# Patient Record
Sex: Male | Born: 1975 | Race: White | Hispanic: No | Marital: Married | Smoking: Former smoker
Health system: Southern US, Community
[De-identification: ages and names within clinical notes are randomized; demographics above are authoritative.]

## PROBLEM LIST (undated history)

## (undated) DIAGNOSIS — D649 Anemia, unspecified: Secondary | ICD-10-CM

## (undated) DIAGNOSIS — H3320 Serous retinal detachment, unspecified eye: Secondary | ICD-10-CM

## (undated) DIAGNOSIS — I4510 Unspecified right bundle-branch block: Secondary | ICD-10-CM

## (undated) DIAGNOSIS — E785 Hyperlipidemia, unspecified: Secondary | ICD-10-CM

## (undated) HISTORY — DX: Unspecified right bundle-branch block: I45.10

## (undated) HISTORY — DX: Serous retinal detachment, unspecified eye: H33.20

## (undated) HISTORY — PX: EYE SURGERY: SHX253

## (undated) HISTORY — PX: SKIN BIOPSY: SHX1

## (undated) HISTORY — DX: Anemia, unspecified: D64.9

## (undated) HISTORY — PX: RETINAL DETACHMENT SURGERY: SHX105

## (undated) HISTORY — DX: Hyperlipidemia, unspecified: E78.5

---

## 2001-10-17 ENCOUNTER — Encounter: Payer: Self-pay | Admitting: Ophthalmology

## 2001-10-18 ENCOUNTER — Ambulatory Visit (HOSPITAL_COMMUNITY): Admission: RE | Admit: 2001-10-18 | Discharge: 2001-10-19 | Payer: Self-pay | Admitting: Ophthalmology

## 2009-03-21 ENCOUNTER — Encounter: Payer: Self-pay | Admitting: Cardiovascular Disease

## 2009-04-04 ENCOUNTER — Ambulatory Visit: Payer: Self-pay | Admitting: Cardiovascular Disease

## 2009-04-04 DIAGNOSIS — H3320 Serous retinal detachment, unspecified eye: Secondary | ICD-10-CM | POA: Insufficient documentation

## 2009-04-04 DIAGNOSIS — F411 Generalized anxiety disorder: Secondary | ICD-10-CM | POA: Insufficient documentation

## 2009-04-04 DIAGNOSIS — I4510 Unspecified right bundle-branch block: Secondary | ICD-10-CM

## 2009-04-04 HISTORY — DX: Serous retinal detachment, unspecified eye: H33.20

## 2009-04-19 ENCOUNTER — Encounter: Payer: Self-pay | Admitting: Cardiovascular Disease

## 2009-04-19 ENCOUNTER — Ambulatory Visit: Payer: Self-pay

## 2009-04-19 ENCOUNTER — Ambulatory Visit: Payer: Self-pay | Admitting: Cardiology

## 2009-04-19 ENCOUNTER — Ambulatory Visit (HOSPITAL_COMMUNITY): Admission: RE | Admit: 2009-04-19 | Discharge: 2009-04-19 | Payer: Self-pay | Admitting: Cardiovascular Disease

## 2009-05-02 ENCOUNTER — Ambulatory Visit: Payer: Self-pay | Admitting: Cardiovascular Disease

## 2010-04-13 NOTE — Assessment & Plan Note (Signed)
Summary: per check out/sf   Visit Type:  rov Primary Provider:  Marisue Brooklyn  CC:  no cardiac complaints today.  History of Present Illness: 35 yo male with h/o anxiety who was seen four weeks ago for further evaluation of abnormal EKG. He was recently found to have a RBBB on resting EKG at his primary care physicians office. He does have occasional anxiety but no chest pain, SOB, palpitations, dizziness, near syncope or syncope. He has had no prior cardiac issues. He works full time and is not limited by any symptoms. He does occasionally drink energy drinks. At the first visit, there was suggestion of incomplete RBBB with RVH. I ordered an echo which showed normal LV size and function with no valvular abnormalities.   Current Medications (verified): 1)  Citalopram Hydrobromide 20 Mg Tabs (Citalopram Hydrobromide) .Marland Kitchen.. 1 Tab Once Daily  Allergies (verified): No Known Drug Allergies  Past History:  Past Medical History: Reviewed history from 04/04/2009 and no changes required. RIGHT BUNDLE BRANCH BLOCK (ICD-426.4) ANXIETY (ICD-300.00) DETACHED RETINA 2004  Social History: Reviewed history from 04/04/2009 and no changes required. Tobacco Use - No.  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    Vital Signs:  Patient profile:   35 year old male Height:      71 inches Weight:      168 pounds Pulse rate:   72 / minute Pulse  rhythm:   regular BP sitting:   100 / 60  (left arm) Cuff size:   large  Vitals Entered By: Danielle Rankin, CMA (May 02, 2009 4:17 PM)  Physical Exam  General:  General: Well developed, well nourished, NAD Musculoskeletal: Muscle strength 5/5 all ext Psychiatric: Mood and affect normal Neck: No JVD, no carotid bruits, no thyromegaly, no lymphadenopathy. Lungs:Clear bilaterally, no wheezes, rhonci, crackles CV: RRR no murmurs, gallops rubs Abdomen: soft, NT, ND, BS present Extremities: No edema, pulses 2+.    Echocardiogram  Procedure date:  04/19/2009  Findings:      - Left ventricle: The cavity size was normal. Wall thickness was       normal. The estimated ejection fraction was 65%. Wall motion was       normal; there were no regional wall motion abnormalities.     - Mitral valve: There is flat closure of the mitral valve. There is       no MR.  Impression & Recommendations:  Problem # 1:  RIGHT BUNDLE BRANCH BLOCK (ICD-426.4) Echo with normal RV and LV size and function. No wall motion abnormalities. No significant valvular issues. I have reassured him that the incomplete RBBB if likely an incidental finding without poor long term prognosis. I have given him a copy of his EKG. No further cardiac workup indicated.   Patient Instructions: 1)  Your physician recommends that you schedule a follow-up appointment in: as needed.

## 2010-04-13 NOTE — Assessment & Plan Note (Signed)
Summary: np6/abn ekg/jml   Visit Type:  Initial Consult Primary Provider:  Marisue Brooklyn  CC:  No complaints.  History of Present Illness: 35 yo male with h/o anxiety who is referred today for further evaluation of abnormal EKG. He was recently found to have a RBBB on resting EKG at his primary care physicians office. He does have occasional anxiety but no chest pain, SOB, palpitations, dizziness, near syncope or syncope. He has had no prior cardiac issues. He works full time and is not limited by any symptoms. He does occasionally drink energy drinks.   Current Medications (verified): 1)  Citalopram Hydrobromide 20 Mg Tabs (Citalopram Hydrobromide) .Marland Kitchen.. 1 Tab Once Daily  Allergies (verified): No Known Drug Allergies  Past History:  Past Medical History: RIGHT BUNDLE BRANCH BLOCK (ICD-426.4) ANXIETY (ICD-300.00) DETACHED RETINA 2004  Past Surgical History: Retina repair  Family History: Mother- alive, CAD/MI at age 81 Father-alive, HTN Brother-younger,healthy  Social History: Tobacco Use - No.  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    Vital Signs:  Patient profile:   35 year old male Height:      71 inches Weight:      170 pounds BMI:     23.80 Pulse rate:   57 / minute Pulse rhythm:   irregular BP sitting:   110 / 68  (left arm) Cuff size:   large  Vitals Entered By: Danielle Rankin, CMA (April 04, 2009  11:17 AM)  Physical Exam  General:  General: Well developed, well nourished, NAD HEENT: OP clear, mucus membranes moist SKIN: warm, dry Neuro: No focal deficits Musculoskeletal: Muscle strength 5/5 all ext Psychiatric: Mood and affect normal Neck: No JVD, no carotid bruits, no thyromegaly, no lymphadenopathy. Lungs:Clear bilaterally, no wheezes, rhonci, crackles CV: RRR no murmurs, gallops rubs Abdomen: soft, NT, ND, BS present Extremities: No edema, pulses 2+.    EKG  Procedure date:  04/04/2009  Findings:      Sinus bradycardia, rate 57 bpm. Incomplete RBBB. RVH.   Impression & Recommendations:  Problem # 1:  RIGHT BUNDLE BRANCH BLOCK (ICD-426.4) Asymptomatic. Will check echocardiogram. I have discussed  the RBBB and the nature of conduction disturbances. RBBB is usually not associated with other cardiac abnormalities. There is no poor long term prognosis with this finding. I will see him back in several weeks to review his echo. No other workup indicated at this time.   Orders: EKG w/ Interpretation (93000) Echocardiogram (Echo)  Patient Instructions: 1)  Your physician recommends that you schedule a follow-up appointment in: 3-4 weeks 2)  Your physician has requested that you have an echocardiogram.  Echocardiography is a painless test that uses sound waves to create images of your heart. It provides your doctor with information about the size and shape of your heart and how well your heart's chambers and valves are working.  This procedure takes approximately one hour. There are no restrictions for this procedure.

## 2010-04-13 NOTE — Letter (Signed)
Summary: [facility] Office Note  [facility] Office Note   Imported By: Roderic Ovens 04/07/2009 12:26:11  _____________________________________________________________________  External Attachment:    Type:   Image     Comment:   External Document

## 2010-07-28 NOTE — H&P (Signed)
NAME:  Hector Bernard, Hector Bernard                        ACCOUNT NO.:  [id number]   MEDICAL RECORD NO.:  [id number]                   PATIENT TYPE:  OIB   LOCATION:  5735                                 FACILITY:  [facility]   PHYSICIAN:  Guadelupe Sabin, M.D.             DATE OF BIRTH:  1975-10-18   DATE OF ADMISSION:  10/18/2001  DATE OF DISCHARGE:                                HISTORY & PHYSICAL   CHIEF COMPLAINT:  This was an urgent outpatient admission of this 35-year-  old white male admitted with a  rhegmatogenous retinal detachment of the  right eye.   HISTORY OF PRESENT ILLNESS:  This patient denies any specific trauma to the  right eye.  The patient has noted the gradual onset of a visual field defect  in the right eye.  He was seen by Dr. Ernesto Rutherford who noted a retinal  detachment and referred the patient to my office for further evaluation.   PAST MEDICAL HISTORY:  The patient is healthy with a last physical  examination 4 years ago for a job-related physical.   SOCIAL HISTORY:  The patient is married, is a nonsmoker, and takes  occasional social alcoholic drink.   MEDICATIONS:  No current medications.  The patient takes ibuprofen  occasionally for headache and took 2 tablets on October 17, 2001.   REVIEW OF SYSTEMS:  No cardiorespiratory complaints.   PHYSICAL EXAMINATION:  VITAL SIGNS:  As recorded on admission.  Blood  pressure 118/78, respirations 16, heart rate 67, temperature 97.5.  GENERAL APPEARANCE:  The patient is alert, well-nourished, well-developed  white male in acute ocular distress.  HEENT:  Eyes:  Visual acuity without correction, 20/70 right eye, 20/40 left  eye.  Applanation tonometry 16 mm right eye, 19 left eye, at 3:45 p.m.  External ocular and slit-lamp examination:  The eyes are wide and clear with  a clear cornea, deep and clear anterior chamber, and clear lens.  Detailed  fundus examination dilated with Mydriacyl:  Right eye, the vitreous is  essentially clear.  The retina is detached from the 10 to 4 o'clock  position.  From the 11:30 to 2:30 position, there is choroidoretinal atrophy  and scarring at the vitreous base and along the ora serrata.  There is a  small dialysis-like defect at the 2:30 position and also at the 11:30  position.  The detachment has not spread into the macula.  There is a  superficial retinal hemorrhage inferior to the macula.  Left eye, vitreous  clear, retina attached, normal optic nerve, blood vessels, and macula.  No  retinal tear or detachment area seen.  CHEST:  Lungs clear to percussion and auscultation.  HEART:  Normal sinus rhythm.  No cardiomegaly, no murmurs.  ABDOMEN:  Negative.  EXTREMITIES:  Negative.   ADMISSION DIAGNOSIS:  Rhegmatogenous retinal detachment, right eye.    SURGICAL PLAN:  Scleral buckling procedure, right eye.  The patient has been  given oral discussion and printed information concerning the retinal  detachment and its surgery.  He has signed an informed consent and  arrangements made for his outpatient admission at this time.                                               Guadelupe Sabin, M.D.    HNJ/MEDQ  D:  10/18/2001  T:  10/22/2001  Job:  [id number]   cc:   Molly Maduro L. Dione Booze, M.D.

## 2010-07-28 NOTE — Op Note (Signed)
NAME:  Hector Bernard, Hector Bernard                        ACCOUNT NO.:  [id number]   MEDICAL RECORD NO.:  [id number]                   PATIENT TYPE:  OIB   LOCATION:  5735                                 FACILITY:  [facility]   PHYSICIAN:  Guadelupe Sabin, M.D.             DATE OF BIRTH:  06-Mar-1976   DATE OF PROCEDURE:  10/18/2001  DATE OF DISCHARGE:                                 OPERATIVE REPORT   PREOPERATIVE DIAGNOSIS:  Rhegmatogenous retinal detachment, right eye.   POSTOPERATIVE DIAGNOSIS:  Rhegmatogenous retinal detachment, right eye.   OPERATION:  Scleral buckling procedure, right eye, using solid silicone  implants, #377 and #240, cryoapplication.  Diathermy application.  Transscleral drainage of subretinal fluid.  Paracentesis.   SURGEON:  Guadelupe Sabin, M.D.   ASSISTANT:  Nurse.   ANESTHESIA:  General.  Ophthalmoscopy as previously drawn.   OPERATIVE PROCEDURE:  After the patient was prepped and draped, lid traction  sutures were placed in the right upper and lower lids.  A lid speculum was  inserted.  A peritomy was performed adjacent to the limbus 360 degrees.  The  subconjunctival tissue was cleaned and the sclera inspected and appeared to  be in satisfactory thickness for lamellar scleral dissection.  Indirect  ophthalmoscopy was then performed with a retinal cryoprobe scleral  depression.  Cryoapplications were applied along the area of the retinal  breaks at the ora serrata from the 11 to 2:30 position.  Choroidoretinal  atrophy and scarring were noted.  It was felt that these findings were  consistent with the previous trauma to the eye but no trauma has been  elicited from the patient's history.  These findings were consistent with ?  trauma, but the patient has no history of direct injury or blow to the right  eye.  It was then elected to perform lamellar scleral dissection from the  10:30 to 4:00 position, the bed measuring 9 mm in width.  Right __________  diathermy applications were applied to the inner scleral lamella.  A total  of 5 - 4.0 green [place] sutures were used to close the sterile flaps over  a trimmed #277 solid silicone implant.  A #240 solid silicone encircling dam  was placed about the globe, tied with a suture at the 8 o'clock position.  An anchoring band suture was placed at the 7:30 position to hold the  encircling band at the equator.  After repeat indirect ophthalmoscopy was  elected to drain fluid in the bed at the 12:30 position.  Incision was made  through the scleral, the choroid exposed, treated with light diathermy, and  then perforated with the pin electrode.  A moderate amount of subretinal  fluid drained and then stopped.  There appeared to a retinal incarceration  at the drain site and no further drainage was performed.  Due to the minimal  amount of subretinal fluid present remaining, it was felt that a  paracentesis was safer than continued subretinal fluid drainage.  A  paracentesis was performed at the 9 o'clock position using a [employer]  blade.  The scleral flaps were then pulled up creating a scleral buckling  indentation.  The buckle appeared to be in good position.  The  cryoapplications and diathermy applications could be seen extending from the  treated area from the 11 to 4 o'clock position.  It was then elected to  close.  The patient had been given Diamox 500 mg intravenously to further  lower the interocular pressure.  Tenon capsule was pulled forward in the  four quadrants and tied as a separate layer.  Full strength Neosporin was  irrigated in the subtenon space.  The conjunctiva was then pulled forward  and closed with a running 6-0 chromic catgut suture.  __________ garamycin  and dexamethasone were injected in the subtenon space inferiorly.  Maxitrol  and atropine ointment were instilled in the conjunctival cul-de-sac and a  light  patch and protective sheild applied to the operated  eye.  Duration of  procedure and anesthesia administration:  1 1/2 to 2 hours.  The patient  tolerated the procedure well in general and left the operating room for the  recovery room and subsequently to the 2300 hour observation unit.                                                Guadelupe Sabin, M.D.    HNJ/MEDQ  D:  10/18/2001  T:  10/22/2001  Job:  [id number]   cc:   Molly Maduro L. Dione Booze, M.D.

## 2010-07-28 NOTE — Discharge Summary (Signed)
   NAME:  Hector Bernard, Hector Bernard                        ACCOUNT NO.:  [id number]   MEDICAL RECORD NO.:  [id number]                   PATIENT TYPE:  OIB   LOCATION:  5735                                 FACILITY:  [facility]   PHYSICIAN:  Amiya Escamilla DICTATOR                    DATE OF BIRTH:  [birth year]   DATE OF ADMISSION:  10/18/2001  DATE OF DISCHARGE:                                 DISCHARGE SUMMARY   HISTORY OF PRESENT ILLNESS:  This was an emergency outpatient admission of  this 35 year old white male admitted with a rhegmatogenous retinal  detachment of the right eye.   HOSPITAL COURSE:  The patient was evaluated preoperatively and felt to be in  satisfactory condition for the proposed surgery. He therefore was taken into  the operating room where a scleral buckling procedure was performed under  general anesthesia using ____ implants #277 and #240 with cryo applications,  diathermia application, and external drainage of subretinal fluid and  paracentesis. The patient tolerated the one and one half hour procedure  under general anesthesia well and was taken to the recovery room and  subsequently to the 23 hour observation unit. The patient was seen on the  evening of surgery and the following morning and felt to be doing well. The  subretinal fluid had absorbed completely and a good buckling indentation was  present in the area of the peripheral choreal retinal scarring and retinal  tearing from the 11:00 o'clock to 4:00 o'clock position. The patient and his  wife were given a printed list of discharge instructions on the care and use  of the operated eye.   DISCHARGE MEDICATIONS:  1. TobraDex and Cyclomydril ophthalmic solutions one drop five to ten     minutes four times daily.  2. Maxitrol and Atropine at bedtime.   FOLLOW UP:  In my office in three to five days.   DISCHARGE CONDITION::  Improved.   DISCHARGE DIAGNOSIS:  Rhegmatogenous retinal detachment, right eye, with  multiple  defects.                                               Zakirah Weingart DICTATOR    DD/MEDQ  D:  10/19/2001  T:  10/23/2001  Job:  [id number]   cc:   Molly Maduro L. Dione Booze, M.D.

## 2013-01-26 ENCOUNTER — Encounter: Payer: Self-pay | Admitting: Internal Medicine

## 2013-01-27 ENCOUNTER — Encounter: Payer: Self-pay | Admitting: Emergency Medicine

## 2013-06-29 ENCOUNTER — Telehealth: Payer: Self-pay | Admitting: *Deleted

## 2013-06-29 ENCOUNTER — Other Ambulatory Visit: Payer: Self-pay | Admitting: Emergency Medicine

## 2013-06-29 MED ORDER — CITALOPRAM HYDROBROMIDE 40 MG PO TABS: 40.0000 mg | ORAL_TABLET | Freq: Every day | ORAL | Status: DC

## 2013-06-29 NOTE — Telephone Encounter (Signed)
REFILL CELEXA      PT COMPLETELY OUT APPT TOMORROW

## 2013-06-30 ENCOUNTER — Encounter: Payer: Self-pay | Admitting: Emergency Medicine

## 2013-07-02 ENCOUNTER — Other Ambulatory Visit: Payer: Self-pay | Admitting: Emergency Medicine

## 2013-07-07 ENCOUNTER — Ambulatory Visit (INDEPENDENT_AMBULATORY_CARE_PROVIDER_SITE_OTHER): Payer: BC Managed Care – PPO | Admitting: Emergency Medicine

## 2013-07-07 ENCOUNTER — Encounter: Payer: Self-pay | Admitting: Emergency Medicine

## 2013-07-07 VITALS — BP 116/74 | HR 76 | Temp 98.6°F | Resp 18 | Ht 70.5 in | Wt 186.0 lb

## 2013-07-07 DIAGNOSIS — IMO0002 Reserved for concepts with insufficient information to code with codable children: Secondary | ICD-10-CM

## 2013-07-07 DIAGNOSIS — R451 Restlessness and agitation: Secondary | ICD-10-CM

## 2013-07-07 DIAGNOSIS — D239 Other benign neoplasm of skin, unspecified: Secondary | ICD-10-CM

## 2013-07-07 DIAGNOSIS — S99929A Unspecified injury of unspecified foot, initial encounter: Secondary | ICD-10-CM

## 2013-07-07 DIAGNOSIS — I10 Essential (primary) hypertension: Secondary | ICD-10-CM

## 2013-07-07 DIAGNOSIS — Z79899 Other long term (current) drug therapy: Secondary | ICD-10-CM

## 2013-07-07 DIAGNOSIS — D229 Melanocytic nevi, unspecified: Secondary | ICD-10-CM

## 2013-07-07 LAB — CBC WITH DIFFERENTIAL/PLATELET
Basophils Absolute: 0.1 10*3/uL (ref 0.0–0.1)
Basophils Relative: 1 % (ref 0–1)
Eosinophils Absolute: 0.2 10*3/uL (ref 0.0–0.7)
Eosinophils Relative: 3 % (ref 0–5)
HCT: 45.3 % (ref 39.0–52.0)
Hemoglobin: 16.1 g/dL (ref 13.0–17.0)
LYMPHS ABS: 2.6 10*3/uL (ref 0.7–4.0)
LYMPHS PCT: 37 % (ref 12–46)
MCH: 32.5 pg (ref 26.0–34.0)
MCHC: 35.5 g/dL (ref 30.0–36.0)
MCV: 91.3 fL (ref 78.0–100.0)
Monocytes Absolute: 0.6 10*3/uL (ref 0.1–1.0)
Monocytes Relative: 8 % (ref 3–12)
NEUTROS ABS: 3.6 10*3/uL (ref 1.7–7.7)
NEUTROS PCT: 51 % (ref 43–77)
PLATELETS: 316 10*3/uL (ref 150–400)
RBC: 4.96 MIL/uL (ref 4.22–5.81)
RDW: 14.1 % (ref 11.5–15.5)
WBC: 7.1 10*3/uL (ref 4.0–10.5)

## 2013-07-07 MED ORDER — CITALOPRAM HYDROBROMIDE 40 MG PO TABS: 40.0000 mg | ORAL_TABLET | Freq: Every day | ORAL | Status: DC

## 2013-07-07 NOTE — Progress Notes (Signed)
   Subjective:    Patient ID: Hector Bernard, male    DOB: Aug 27, 1975, 38 y.o.   MRN: [id number]  HPI Comments: 38 yo WM for RX refill OV. He notes he is doing well on Citalopram. He went off x 4 days cold [place] and felt awful. He notes RX helps to take edge off and make him decrease anxiety/ stress level with work.  He has noticed skin change on right forearm. He gets an elevated scab that itches and falls off and then returns shortly after. He has not had f/u with derm.  He dropped board on great toe over 1 week ago. He notes initially difficult to walk but has improved. He notes still tender around nail bed.     Medication List       This list is accurate as of: 07/07/13  5:01 PM.  Always use your most recent med list.               citalopram 40 MG tablet  Commonly known as:  CELEXA  Take 1 tablet (40 mg total) by mouth daily.     multivitamin with minerals tablet  Take 1 tablet by mouth daily.         Review of Systems  Skin: Positive for color change.  All other systems reviewed and are negative.  BP 116/74  Pulse 76  Temp(Src) 98.6 F (37 C) (Temporal)  Resp 18  Ht 5' 10.5" (1.791 m)  Wt 186 lb (84.369 kg)  BMI 26.30 kg/m2     Objective:   Physical Exam  Nursing note and vitals reviewed. Constitutional: He is oriented to person, place, and time. He appears well-developed and well-nourished.  HENT:  Head: Normocephalic and atraumatic.  Right Ear: External ear normal.  Left Ear: External ear normal.  Nose: Nose normal.  Mouth/Throat: Oropharynx is clear and moist. No oropharyngeal exudate.  Eyes: Conjunctivae are normal.  Neck: Normal range of motion.  Cardiovascular: Normal rate, regular rhythm, normal heart sounds and intact distal pulses.   Pulmonary/Chest: Effort normal and breath sounds normal.  Abdominal: Soft.  Musculoskeletal: Normal range of motion. He exhibits edema and tenderness.  Great toe mild at base of nail bed tenderness with ROM   Lymphadenopathy:    He has no cervical adenopathy.  Neurological: He is alert and oriented to person, place, and time.  Skin: Skin is warm and dry.  Right forearm with 4 mm elevated, erythematous scaling area. Great toe with base of nail bed slightly lifted with + ecchymosis.   Psychiatric: He has a normal mood and affect. His behavior is normal. Judgment and thought content normal.          Assessment & Plan:  1. Right Great toe injury- advised epsom salt soaks and will probably lose nail. W/c with any concerns, declines xray  2. ?SCC right forearm- He w/c for derm evaluation  3. Anxiety/ Agitation- recheck labs with medication, continue RX AD, w/c with concerns

## 2013-07-07 NOTE — Patient Instructions (Signed)
Squamous Cell Carcinoma  Squamous cell carcinoma is the second most common form of skin cancer. It begins in the squamous cells in the outer layer of the skin (epidermis).  CAUSES  Ultraviolet light exposure is the most common cause of squamous cell carcinoma. This may come from sunlight or tanning beds. Squamous cell carcinoma is most common in sun-exposed areas like the face, neck, arms, and hands. However, squamous cell carcinoma can occur anywhere on the body, including the lips, inside the mouth, the legs, sites of long-term (chronic) scarring, and the anus.  Other causes of squamous cell carcinoma can include:  Exposure to arsenic.  Exposure to radiation.  Exposure to toxic tars and oils. RISK FACTORS Factors that increase your risk for squamous cell carcinoma include:  Having fair skin.  Being middle-aged or elderly.  Heavy sun exposure, especially during childhood.  Repeated sunburns.  Use of tanning beds.  A weakened immune system. This includes patients who have received a transplant and patients with human immunodeficiency virus (HIV) or acquired immunodeficency syndrome (AIDS).  Human papillomavirus infection.  Conditions that cause chronic scarring. This can include burn scars, chronic ulcers, heat (thermal) injuries, and radiation.  Exposure to psoralen plus ultraviolet A light therapy.  Exposure to chemical carcinogens, such as tar, soot, and arsenic.  Chronic, inflammatory conditions such as lupus, lichen planus, or lichen sclerosus.  Chronic infections, such as infections of the bone (osteomyelitis).  Smoking. SYMPTOMS  Squamous cell carcinoma often starts as small, skin-colored (pink or brown) sandpaper-like growths. These growths are called solar keratoses or actinic keratoses. These growths are often more easily felt than seen.  DIAGNOSIS  Your caregiver may be able to tell what is wrong by doing a physical exam. Often, a tissue sample is also taken. The  tissue sample is examined under a microscope.  TREATMENT  The treatment for squamous cell carcinoma depends on the size and location of the tumors, as well as your overall health. Possible treatments include:   Mohs surgery. This is a procedure done by a skin doctor (dermatologist or Mohs surgeon) in his or her office. The cancerous cells are removed layer by layer.  Laser surgery to remove the tumor.  Freezing the tumor with liquid nitrogen (cryosurgery).  Radiation. This may be used for tumors on the face.  Electrodesiccation and curettage. This involves alternately scraping and burning the tumor, using an electric current to control bleeding. If treated soon enough, squamous cell carcinoma rarely spreads to other areas of the body (metastasizes). If left untreated, however, squamous cell carcinoma will destroy the nearby tissues. This can result in the loss of a nose or ear. PREVENTION  Avoid the sun between 10:00 am and 4:00 pm when it is the strongest.  Use a sunscreen or sunblock with sun protection factor 30 or greater.  Apply sunscreen at least 30 minutes before exposure to the sun.  Reapply sunscreen every 2 to 4 hours while you are outside, after swimming, and after excessive sweating.  Always wear protective hats, clothing, and sunglasses with ultraviolet protection.  Avoid tanning beds. HOME CARE INSTRUCTIONS   Avoid unprotected sun exposure.  Do not smoke.  Follow your caregiver's instructions for self-exams. Look for new growths or changes in your skin.  Keep all follow-up appointments as directed by your caregiver. SEEK MEDICAL CARE IF:   You notice any new growths or changes in your skin.  You have had a squamous cell carcinoma tumor removed and you notice a new growth in  the same location. Document Released: 09/02/2002 Document Revised: 05/21/2011 Document Reviewed: 11/20/2010 [facility] Patient Information 2014 [employer].

## 2013-07-08 LAB — BASIC METABOLIC PANEL WITH GFR
BUN: 13 mg/dL (ref 6–23)
CO2: 28 mEq/L (ref 19–32)
Calcium: 9.9 mg/dL (ref 8.4–10.5)
Chloride: 101 mEq/L (ref 96–112)
Creat: 0.9 mg/dL (ref 0.50–1.35)
GFR, Est African American: >89 mL/min
GFR, Est Non African American: >89 mL/min
Glucose, Bld: 88 mg/dL (ref 70–99)
Potassium: 4.1 mEq/L (ref 3.5–5.3)
Sodium: 138 mEq/L (ref 135–145)

## 2013-07-08 LAB — HEPATIC FUNCTION PANEL
ALT: 20 U/L (ref 0–53)
AST: 17 U/L (ref 0–37)
Albumin: 4.6 g/dL (ref 3.5–5.2)
Alkaline Phosphatase: 63 U/L (ref 39–117)
Bilirubin, Direct: 0.1 mg/dL (ref 0.0–0.3)
Indirect Bilirubin: 0.3 mg/dL (ref 0.2–1.2)
Total Bilirubin: 0.4 mg/dL (ref 0.2–1.2)
Total Protein: 7.4 g/dL (ref 6.0–8.3)

## 2014-01-12 ENCOUNTER — Encounter: Payer: Self-pay | Admitting: Emergency Medicine

## 2014-01-28 ENCOUNTER — Encounter: Payer: Self-pay | Admitting: Emergency Medicine

## 2014-01-28 ENCOUNTER — Ambulatory Visit (INDEPENDENT_AMBULATORY_CARE_PROVIDER_SITE_OTHER): Payer: BC Managed Care – PPO | Admitting: Emergency Medicine

## 2014-01-28 VITALS — BP 110/60 | HR 62 | Temp 98.4°F | Resp 18 | Ht 70.5 in | Wt 195.0 lb

## 2014-01-28 DIAGNOSIS — Z0000 Encounter for general adult medical examination without abnormal findings: Secondary | ICD-10-CM

## 2014-01-28 DIAGNOSIS — Z111 Encounter for screening for respiratory tuberculosis: Secondary | ICD-10-CM

## 2014-01-28 DIAGNOSIS — Z125 Encounter for screening for malignant neoplasm of prostate: Secondary | ICD-10-CM

## 2014-01-28 DIAGNOSIS — Z1212 Encounter for screening for malignant neoplasm of rectum: Secondary | ICD-10-CM

## 2014-01-28 DIAGNOSIS — Z79899 Other long term (current) drug therapy: Secondary | ICD-10-CM

## 2014-01-28 DIAGNOSIS — E559 Vitamin D deficiency, unspecified: Secondary | ICD-10-CM

## 2014-01-28 LAB — CBC WITH DIFFERENTIAL/PLATELET
Basophils Absolute: 0.1 10*3/uL (ref 0.0–0.1)
Basophils Relative: 1 % (ref 0–1)
EOS PCT: 5 % (ref 0–5)
Eosinophils Absolute: 0.4 10*3/uL (ref 0.0–0.7)
HCT: 45.2 % (ref 39.0–52.0)
HEMOGLOBIN: 16 g/dL (ref 13.0–17.0)
LYMPHS ABS: 2.4 10*3/uL (ref 0.7–4.0)
Lymphocytes Relative: 31 % (ref 12–46)
MCH: 32.1 pg (ref 26.0–34.0)
MCHC: 35.4 g/dL (ref 30.0–36.0)
MCV: 90.8 fL (ref 78.0–100.0)
MONO ABS: 0.5 10*3/uL (ref 0.1–1.0)
MPV: 9.2 fL — AB (ref 9.4–12.4)
Monocytes Relative: 7 % (ref 3–12)
Neutro Abs: 4.3 10*3/uL (ref 1.7–7.7)
Neutrophils Relative %: 56 % (ref 43–77)
Platelets: 302 10*3/uL (ref 150–400)
RBC: 4.98 MIL/uL (ref 4.22–5.81)
RDW: 13.9 % (ref 11.5–15.5)
WBC: 7.7 10*3/uL (ref 4.0–10.5)

## 2014-01-28 LAB — BASIC METABOLIC PANEL WITH GFR
BUN: 15 mg/dL (ref 6–23)
CO2: 28 mEq/L (ref 19–32)
Calcium: 10.2 mg/dL (ref 8.4–10.5)
Chloride: 100 mEq/L (ref 96–112)
Creat: 0.98 mg/dL (ref 0.50–1.35)
GFR, Est Non African American: >89 mL/min
GLUCOSE: 86 mg/dL (ref 70–99)
Potassium: 4.3 mEq/L (ref 3.5–5.3)
SODIUM: 135 meq/L (ref 135–145)

## 2014-01-28 LAB — LIPID PANEL
CHOL/HDL RATIO: 3.1 ratio
Cholesterol: 180 mg/dL (ref 0–200)
HDL: 59 mg/dL (ref >39)
LDL CALC: 84 mg/dL (ref 0–99)
TRIGLYCERIDES: 183 mg/dL — AB (ref <150)
VLDL: 37 mg/dL (ref 0–40)

## 2014-01-28 LAB — MAGNESIUM: MAGNESIUM: 2 mg/dL (ref 1.5–2.5)

## 2014-01-28 LAB — HEPATIC FUNCTION PANEL
ALK PHOS: 59 U/L (ref 39–117)
ALT: 16 U/L (ref 0–53)
AST: 15 U/L (ref 0–37)
Albumin: 4.6 g/dL (ref 3.5–5.2)
BILIRUBIN TOTAL: 0.5 mg/dL (ref 0.2–1.2)
Bilirubin, Direct: 0.1 mg/dL (ref 0.0–0.3)
Indirect Bilirubin: 0.4 mg/dL (ref 0.2–1.2)
Total Protein: 7.1 g/dL (ref 6.0–8.3)

## 2014-01-28 LAB — TSH: TSH: 1.777 u[IU]/mL (ref 0.350–4.500)

## 2014-01-28 NOTE — Patient Instructions (Signed)

## 2014-01-28 NOTE — Progress Notes (Signed)
Subjective:    Patient ID: Hector Bernard, male    DOB: 1975-09-22, 38 y.o.   MRN: [id number]  HPI Comments: 38 yo WM CPE. He has restarted running since SEPT 2-3 times week. He is eating healthy and sleeping good. He is busier with work and mild fatigue but otherwise feels well. He denies any other concerns.  Lab Results      Component                Value               Date                      WBC                      7.1                 07/07/2013                HGB                      16.1                07/07/2013                HCT                      45.3                07/07/2013                PLT                      316                 07/07/2013                GLUCOSE                  88                  07/07/2013                ALT                      20                  07/07/2013                AST                      17                  07/07/2013                NA                       138                 07/07/2013                K                        4.1  07/07/2013                CL                       101                 07/07/2013                CREATININE               0.90                07/07/2013                BUN                      13                  07/07/2013                CO2                      28                  07/07/2013            A1C 5.7 VIT D 30 CHOLESTEROL WNL 2014    Medication List       This list is accurate as of: 01/28/14 11:59 PM.  Always use your most recent med list.               citalopram 40 MG tablet  Commonly known as:  CELEXA  Take 1 tablet (40 mg total) by mouth daily.     multivitamin with minerals tablet  Take 1 tablet by mouth daily.       No Known Allergies  Past Medical History  Diagnosis Date  . Hyperlipidemia   . Anemia    Past Surgical History  Procedure Laterality Date  . Skin biopsy Left     ankle, benign   History  Substance Use Topics  . Smoking status: Never  Smoker   . Smokeless tobacco: Not on file  . Alcohol Use: Yes     Comment: 1 qd    Family History  Problem Relation Age of Onset  . Hyperlipidemia Mother   . Heart disease Mother   . Hyperlipidemia Father   . Alcohol abuse Father   . Hypertension Father   . Cancer Other     lung, prostate    MAINTENANCE: DZH:GDJMEQ/ spring 2015 Dentist:q 6-12 month  IMMUNIZATIONS: Tdap:1/10 Influenza: declines  Patient Care Team: Unk Pinto, MD as PCP - General (Internal Medicine) Macarthur Critchley, OD as Referring Physician (Optometry) Burnell Blanks, MD as Consulting Physician (Cardiology) Drusilla Kanner, MD as Referring Physician (Dermatology)   Review of Systems  Constitutional: Negative for fatigue.  Respiratory: Negative for shortness of breath.   Cardiovascular: Negative for chest pain.  All other systems reviewed and are negative.  BP 110/60 mmHg  Pulse 62  Temp(Src) 98.4 F (36.9 C) (Temporal)  Resp 18  Ht 5' 10.5" (1.791 m)  Wt 195 lb (88.451 kg)  BMI 27.57 kg/m2     Objective:   Physical Exam  Constitutional: He is oriented to person, place, and time. He appears well-developed and well-nourished.  HENT:  Head: Normocephalic and atraumatic.  Right Ear: External ear normal.  Left Ear: External ear normal.  Nose: Nose normal.  Mouth/Throat: Oropharynx is clear and moist.  Eyes: Conjunctivae and EOM are normal. Pupils are equal, round, and reactive to light. Right eye exhibits no discharge. Left eye exhibits no discharge. No scleral icterus.  Neck: Normal range of motion. Neck supple. No JVD present. No tracheal deviation present. No thyromegaly present.  Cardiovascular: Normal rate, regular rhythm, normal heart sounds and intact distal pulses.   Pulmonary/Chest: Effort normal and breath sounds normal.  Abdominal: Soft. Bowel sounds are normal. He exhibits no distension and no mass. There is no tenderness. There is no rebound and no guarding.  Genitourinary: Rectum  normal and prostate normal. Guaiac negative stool.  Musculoskeletal: Normal range of motion. He exhibits no edema or tenderness.  Lymphadenopathy:    He has no cervical adenopathy.  Neurological: He is alert and oriented to person, place, and time. He has normal reflexes. No cranial nerve deficit. He exhibits normal muscle tone. Coordination normal.  Skin: Skin is warm and dry. No rash noted. No erythema. No pallor.  Multiple flat med brown nevi on shoulders/ back- due for full at derm  Psychiatric: He has a normal mood and affect. His behavior is normal. Judgment and thought content normal.  Nursing note and vitals reviewed.   IRbbb, RVH, S1S2S3 clockwise rotation Stable since 2011 with NEG CARDIO WORKUP DR [facility]    Assessment & Plan:  1. CPE- Update screening labs/ History/ Immunizations/ Testing as needed. Advised healthy diet, QD exercise, increase H20 and continue RX/ Vitamins AD.  2.  Irreg Nevi- monitor for any change, call if occurs for removal, advised needs yearly full check at [facility], he will make appointment

## 2014-01-29 LAB — URINALYSIS, ROUTINE W REFLEX MICROSCOPIC
Bilirubin Urine: NEGATIVE
Glucose, UA: NEGATIVE mg/dL
Ketones, ur: NEGATIVE mg/dL
Leukocytes, UA: NEGATIVE
NITRITE: NEGATIVE
PROTEIN: NEGATIVE mg/dL
Specific Gravity, Urine: 1.021 (ref 1.005–1.030)
UROBILINOGEN UA: 0.2 mg/dL (ref 0.0–1.0)
pH: 5.5 (ref 5.0–8.0)

## 2014-01-29 LAB — HEMOGLOBIN A1C
Hgb A1c MFr Bld: 5.5 % (ref <5.7)
Mean Plasma Glucose: 111 mg/dL (ref <117)

## 2014-01-29 LAB — URINALYSIS, MICROSCOPIC ONLY
Bacteria, UA: NONE SEEN
Casts: NONE SEEN
Crystals: NONE SEEN
SQUAMOUS EPITHELIAL / LPF: NONE SEEN

## 2014-01-29 LAB — TESTOSTERONE: Testosterone: 318 ng/dL (ref 300–890)

## 2014-01-29 LAB — VITAMIN D 25 HYDROXY (VIT D DEFICIENCY, FRACTURES): Vit D, 25-Hydroxy: 30 ng/mL (ref 30–100)

## 2014-01-29 LAB — PSA: PSA: 0.62 ng/mL (ref <=4.00)

## 2014-01-29 LAB — INSULIN, FASTING: INSULIN FASTING, SERUM: 3.4 u[IU]/mL (ref 2.0–19.6)

## 2014-02-01 ENCOUNTER — Encounter: Payer: Self-pay | Admitting: Emergency Medicine

## 2014-02-01 LAB — TB SKIN TEST
INDURATION: 0 mm
TB Skin Test: NEGATIVE

## 2014-02-16 ENCOUNTER — Other Ambulatory Visit: Payer: BC Managed Care – PPO

## 2014-02-16 ENCOUNTER — Other Ambulatory Visit: Payer: Self-pay | Admitting: *Deleted

## 2014-02-16 DIAGNOSIS — R319 Hematuria, unspecified: Secondary | ICD-10-CM

## 2014-02-16 MED ORDER — CITALOPRAM HYDROBROMIDE 40 MG PO TABS: 40.0000 mg | ORAL_TABLET | Freq: Every day | ORAL | Status: DC

## 2014-02-17 LAB — URINALYSIS, ROUTINE W REFLEX MICROSCOPIC
BILIRUBIN URINE: NEGATIVE
GLUCOSE, UA: NEGATIVE mg/dL
Hgb urine dipstick: NEGATIVE
Ketones, ur: NEGATIVE mg/dL
Leukocytes, UA: NEGATIVE
Nitrite: NEGATIVE
PH: 7 (ref 5.0–8.0)
Protein, ur: NEGATIVE mg/dL
SPECIFIC GRAVITY, URINE: 1.022 (ref 1.005–1.030)
Urobilinogen, UA: 1 mg/dL (ref 0.0–1.0)

## 2014-02-18 ENCOUNTER — Telehealth: Payer: Self-pay

## 2014-02-18 LAB — URINE CULTURE: Colony Count: 30000

## 2014-02-18 NOTE — Telephone Encounter (Signed)
Left message for patient to return call for lab results.

## 2014-02-18 NOTE — Telephone Encounter (Signed)
-----   Message from Vicie Mutters, [place] sent at 02/18/2014  8:11 AM EST ----- Negative urine, will recheck at [place] office visit.

## 2014-07-01 ENCOUNTER — Encounter: Payer: Self-pay | Admitting: Emergency Medicine

## 2014-08-03 ENCOUNTER — Ambulatory Visit: Payer: Self-pay | Admitting: Physician Assistant

## 2014-08-21 ENCOUNTER — Other Ambulatory Visit: Payer: Self-pay | Admitting: Internal Medicine

## 2014-08-30 ENCOUNTER — Other Ambulatory Visit: Payer: Self-pay

## 2014-08-30 MED ORDER — CITALOPRAM HYDROBROMIDE 40 MG PO TABS: 40.0000 mg | ORAL_TABLET | Freq: Every day | ORAL | Status: DC

## 2014-08-31 ENCOUNTER — Encounter: Payer: Self-pay | Admitting: Physician Assistant

## 2014-08-31 ENCOUNTER — Ambulatory Visit (INDEPENDENT_AMBULATORY_CARE_PROVIDER_SITE_OTHER): Payer: 59 | Admitting: Physician Assistant

## 2014-08-31 VITALS — BP 110/72 | HR 68 | Temp 97.9°F | Resp 16 | Ht 70.5 in | Wt 202.0 lb

## 2014-08-31 DIAGNOSIS — E785 Hyperlipidemia, unspecified: Secondary | ICD-10-CM | POA: Insufficient documentation

## 2014-08-31 DIAGNOSIS — Z79899 Other long term (current) drug therapy: Secondary | ICD-10-CM

## 2014-08-31 DIAGNOSIS — F411 Generalized anxiety disorder: Secondary | ICD-10-CM

## 2014-08-31 DIAGNOSIS — E559 Vitamin D deficiency, unspecified: Secondary | ICD-10-CM

## 2014-08-31 LAB — MAGNESIUM: Magnesium: 2.1 mg/dL (ref 1.5–2.5)

## 2014-08-31 LAB — BASIC METABOLIC PANEL WITH GFR
BUN: 9 mg/dL (ref 6–23)
CALCIUM: 10 mg/dL (ref 8.4–10.5)
CHLORIDE: 103 meq/L (ref 96–112)
CO2: 22 mEq/L (ref 19–32)
CREATININE: 0.9 mg/dL (ref 0.50–1.35)
GFR, Est African American: >89 mL/min
Glucose, Bld: 89 mg/dL (ref 70–99)
Potassium: 4.2 mEq/L (ref 3.5–5.3)
Sodium: 138 mEq/L (ref 135–145)

## 2014-08-31 LAB — TSH: TSH: 1.014 u[IU]/mL (ref 0.350–4.500)

## 2014-08-31 LAB — CBC WITH DIFFERENTIAL/PLATELET
BASOS ABS: 0.1 10*3/uL (ref 0.0–0.1)
Basophils Relative: 1 % (ref 0–1)
EOS ABS: 0.2 10*3/uL (ref 0.0–0.7)
Eosinophils Relative: 3 % (ref 0–5)
HCT: 47.3 % (ref 39.0–52.0)
HEMOGLOBIN: 16.8 g/dL (ref 13.0–17.0)
Lymphocytes Relative: 27 % (ref 12–46)
Lymphs Abs: 1.7 10*3/uL (ref 0.7–4.0)
MCH: 33.2 pg (ref 26.0–34.0)
MCHC: 35.5 g/dL (ref 30.0–36.0)
MCV: 93.5 fL (ref 78.0–100.0)
MPV: 10.3 fL (ref 8.6–12.4)
Monocytes Absolute: 0.5 10*3/uL (ref 0.1–1.0)
Monocytes Relative: 8 % (ref 3–12)
Neutro Abs: 3.8 10*3/uL (ref 1.7–7.7)
Neutrophils Relative %: 61 % (ref 43–77)
Platelets: 229 10*3/uL (ref 150–400)
RBC: 5.06 MIL/uL (ref 4.22–5.81)
RDW: 14.1 % (ref 11.5–15.5)
WBC: 6.2 10*3/uL (ref 4.0–10.5)

## 2014-08-31 LAB — HEPATIC FUNCTION PANEL
ALT: 22 U/L (ref 0–53)
AST: 21 U/L (ref 0–37)
Albumin: 4.7 g/dL (ref 3.5–5.2)
Alkaline Phosphatase: 78 U/L (ref 39–117)
Bilirubin, Direct: 0.1 mg/dL (ref 0.0–0.3)
Indirect Bilirubin: 0.5 mg/dL (ref 0.2–1.2)
TOTAL PROTEIN: 7.4 g/dL (ref 6.0–8.3)
Total Bilirubin: 0.6 mg/dL (ref 0.2–1.2)

## 2014-08-31 LAB — LIPID PANEL
CHOL/HDL RATIO: 3 ratio
CHOLESTEROL: 154 mg/dL (ref 0–200)
HDL: 51 mg/dL (ref >=40)
LDL Cholesterol: 78 mg/dL (ref 0–99)
Triglycerides: 123 mg/dL (ref <150)
VLDL: 25 mg/dL (ref 0–40)

## 2014-08-31 NOTE — Progress Notes (Signed)
Assessment and Plan:  Anxiety state - Plan: TSH  Hyperlipidemia - Plan: CBC with Differential/Platelet, BASIC METABOLIC PANEL WITH GFR, Hepatic function panel, Lipid panel  Vitamin D deficiency - Plan: Vit D  25 hydroxy (rtn osteoporosis monitoring)  Medication management - Plan: Magnesium  Smoking cessation- Discussed  Continue diet and meds as discussed. Further disposition pending results of labs. Over 30 minutes of exam, counseling, chart review, and critical decision making was performed  HPI 39 y.o. male  presents for 3 month follow up on hypertension, cholesterol, prediabetes, and vitamin D deficiency.   His blood pressure has been controlled at home, today their BP is BP: 110/72 mmHg  He does workout. He denies chest pain, shortness of breath, dizziness.  He is not on cholesterol medication and denies myalgias. His cholesterol is at goal. The cholesterol last visit was:   Lab Results  Component Value Date   CHOL 180 01/28/2014   HDL 59 01/28/2014   LDLCALC 84 01/28/2014   TRIG 183* 01/28/2014   CHOLHDL 3.1 01/28/2014   Last A1C in the office was:  Lab Results  Component Value Date   HGBA1C 5.5 01/28/2014   Patient is on Vitamin D supplement.   Lab Results  Component Value Date   VD25OH 30 01/28/2014     He is on celexa for anxiety which is helping.  He has decreased drinking, will do occ wine, does smoke 2 pack of cigs a week, just started 3 months.   Current Medications:  Current Outpatient Prescriptions on File Prior to Visit  Medication Sig Dispense Refill  . citalopram (CELEXA) 40 MG tablet Take 1 tablet (40 mg total) by mouth daily. 30 tablet 0  . Multiple Vitamins-Minerals (MULTIVITAMIN WITH MINERALS) tablet Take 1 tablet by mouth daily.     No current facility-administered medications on file prior to visit.   Medical History:  Past Medical History  Diagnosis Date  . Hyperlipidemia   . Anemia    Allergies: No Known Allergies   Review of Systems:   Review of Systems  Constitutional: Negative.   HENT: Negative.   Eyes: Negative.   Respiratory: Negative.   Cardiovascular: Negative.   Gastrointestinal: Negative.   Genitourinary: Negative.   Musculoskeletal: Negative.   Skin: Negative.   Neurological: Negative.   Endo/Heme/Allergies: Negative.   Psychiatric/Behavioral: Negative.     Family history- Review and unchanged Social history- Review and unchanged Physical Exam: BP 110/72 mmHg  Pulse 68  Temp(Src) 97.9 F (36.6 C)  Resp 16  Ht 5' 10.5" (1.791 m)  Wt 202 lb (91.627 kg)  BMI 28.56 kg/m2 Wt Readings from Last 3 Encounters:  08/31/14 202 lb (91.627 kg)  01/28/14 195 lb (88.451 kg)  07/07/13 186 lb (84.369 kg)   General Appearance: Well nourished, in no apparent distress. Eyes: PERRLA, EOMs, conjunctiva no swelling or erythema Sinuses: No Frontal/maxillary tenderness ENT/Mouth: Ext aud canals clear, TMs without erythema, bulging. No erythema, swelling, or exudate on post pharynx.  Tonsils not swollen or erythematous. Hearing normal.  Neck: Supple, thyroid normal.  Respiratory: Respiratory effort normal, BS equal bilaterally without rales, rhonchi, wheezing or stridor.  Cardio: RRR with no MRGs. Brisk peripheral pulses without edema.  Abdomen: Soft, + BS,  Non tender, no guarding, rebound, hernias, masses. Lymphatics: Non tender without lymphadenopathy.  Musculoskeletal: Full ROM, 5/5 strength, Normal gait Skin: Warm, dry without rashes, lesions, ecchymosis.  Neuro: Cranial nerves intact. Normal muscle tone, no cerebellar symptoms. Psych: Awake and oriented X 3, normal affect,  Insight and Judgment appropriate.    Vicie Mutters, PA-C 12:16 PM [facility]

## 2014-09-01 LAB — VITAMIN D 25 HYDROXY (VIT D DEFICIENCY, FRACTURES): Vit D, 25-Hydroxy: 35 ng/mL (ref 30–100)

## 2014-09-28 ENCOUNTER — Other Ambulatory Visit: Payer: Self-pay | Admitting: Emergency Medicine

## 2014-09-28 ENCOUNTER — Other Ambulatory Visit: Payer: Self-pay

## 2014-09-28 ENCOUNTER — Other Ambulatory Visit: Payer: Self-pay | Admitting: Physician Assistant

## 2014-09-28 MED ORDER — CITALOPRAM HYDROBROMIDE 40 MG PO TABS: 40.0000 mg | ORAL_TABLET | Freq: Every day | ORAL | Status: DC

## 2014-12-27 ENCOUNTER — Other Ambulatory Visit: Payer: Self-pay

## 2014-12-27 MED ORDER — CITALOPRAM HYDROBROMIDE 40 MG PO TABS: 40.0000 mg | ORAL_TABLET | Freq: Every day | ORAL | Status: DC

## 2015-02-09 ENCOUNTER — Encounter: Payer: Self-pay | Admitting: Physician Assistant

## 2015-02-24 ENCOUNTER — Encounter: Payer: Self-pay | Admitting: Physician Assistant

## 2015-02-24 ENCOUNTER — Ambulatory Visit (INDEPENDENT_AMBULATORY_CARE_PROVIDER_SITE_OTHER): Payer: BC Managed Care – PPO | Admitting: Physician Assistant

## 2015-02-24 VITALS — BP 110/70 | HR 68 | Temp 97.9°F | Resp 16 | Ht 70.5 in | Wt 184.0 lb

## 2015-02-24 DIAGNOSIS — Z79899 Other long term (current) drug therapy: Secondary | ICD-10-CM

## 2015-02-24 DIAGNOSIS — Z86018 Personal history of other benign neoplasm: Secondary | ICD-10-CM | POA: Insufficient documentation

## 2015-02-24 DIAGNOSIS — E559 Vitamin D deficiency, unspecified: Secondary | ICD-10-CM | POA: Diagnosis not present

## 2015-02-24 DIAGNOSIS — R5383 Other fatigue: Secondary | ICD-10-CM

## 2015-02-24 DIAGNOSIS — D239 Other benign neoplasm of skin, unspecified: Secondary | ICD-10-CM | POA: Insufficient documentation

## 2015-02-24 DIAGNOSIS — Z0000 Encounter for general adult medical examination without abnormal findings: Secondary | ICD-10-CM | POA: Diagnosis not present

## 2015-02-24 DIAGNOSIS — F411 Generalized anxiety disorder: Secondary | ICD-10-CM

## 2015-02-24 DIAGNOSIS — H3323 Serous retinal detachment, bilateral: Secondary | ICD-10-CM

## 2015-02-24 DIAGNOSIS — D649 Anemia, unspecified: Secondary | ICD-10-CM

## 2015-02-24 DIAGNOSIS — Z1389 Encounter for screening for other disorder: Secondary | ICD-10-CM

## 2015-02-24 DIAGNOSIS — I4510 Unspecified right bundle-branch block: Secondary | ICD-10-CM

## 2015-02-24 DIAGNOSIS — Z131 Encounter for screening for diabetes mellitus: Secondary | ICD-10-CM

## 2015-02-24 DIAGNOSIS — E785 Hyperlipidemia, unspecified: Secondary | ICD-10-CM

## 2015-02-24 NOTE — Patient Instructions (Signed)
Preventive Care for Adults A healthy lifestyle and preventive care can promote health and wellness. Preventive health guidelines for men include the following key practices: 1. A routine yearly physical is a good way to check with your health care provider about your health and preventative screening. It is a chance to share any concerns and updates on your health and to receive a thorough exam. 2. Visit your dentist for a routine exam and preventative care every 6 months. Brush your teeth twice a day and floss once a day. Good oral hygiene prevents tooth decay and gum disease. 3. The frequency of eye exams is based on your age, health, family medical history, use of contact lenses, and other factors. Follow your health care provider's recommendations for frequency of eye exams. 4. Eat a healthy diet. Foods such as vegetables, fruits, whole grains, low-fat dairy products, and lean protein foods contain the nutrients you need without too many calories. Decrease your intake of foods high in solid fats, added sugars, and salt. Eat the right amount of calories for you.Get information about a proper diet from your health care provider, if necessary. 5. Regular physical exercise is one of the most important things you can do for your health. Most adults should get at least 150 minutes of moderate-intensity exercise (any activity that increases your heart rate and causes you to sweat) each week. In addition, most adults need muscle-strengthening exercises on 2 or more days a week. 6. Maintain a healthy weight. The body mass index (BMI) is a screening tool to identify possible weight problems. It provides an estimate of body fat based on height and weight. Your health care provider can find your BMI and can help you achieve or maintain a healthy weight.For adults 20 years and older: 1. A BMI below 18.5 is considered underweight. 2. A BMI of 18.5 to 24.9 is normal. 3. A BMI of 25 to 29.9 is considered  overweight. 4. A BMI of 30 and above is considered obese. 7. Maintain normal blood lipids and cholesterol levels by exercising and minimizing your intake of saturated fat. Eat a balanced diet with plenty of fruit and vegetables. Blood tests for lipids and cholesterol should begin at age 23 and be repeated every 5 years. If your lipid or cholesterol levels are high, you are over 50, or you are at high risk for heart disease, you may need your cholesterol levels checked more frequently.Ongoing high lipid and cholesterol levels should be treated with medicines if diet and exercise are not working. 8. If you smoke, find out from your health care provider how to quit. If you do not use tobacco, do not start. 9. Lung cancer screening is recommended for adults aged 92-80 years who are at high risk for developing lung cancer because of a history of smoking. A yearly low-dose CT scan of the lungs is recommended for people who have at least a 30-pack-year history of smoking and are a current smoker or have quit within the past 15 years. A pack year of smoking is smoking an average of 1 pack of cigarettes a day for 1 year (for example: 1 pack a day for 30 years or 2 packs a day for 15 years). Yearly screening should continue until the smoker has stopped smoking for at least 15 years. Yearly screening should be stopped for people who develop a health problem that would prevent them from having lung cancer treatment. 10. If you choose to drink alcohol, do not have more than  2 drinks per day. One drink is considered to be 12 ounces (355 mL) of beer, 5 ounces (148 mL) of wine, or 1.5 ounces (44 mL) of liquor. 11. High blood pressure causes heart disease and increases the risk of stroke. Your blood pressure should be checked. Ongoing high blood pressure should be treated with medicines, if weight loss and exercise are not effective. 53. If you are 2-76 years old, ask your health care provider if you should take aspirin to  prevent heart disease. 13. Diabetes screening involves taking a blood sample to check your fasting blood sugar level. Testing should be considered at a younger age or be carried out more frequently if you are overweight and have at least 1 risk factor for diabetes. 14. Colorectal cancer can be detected and often prevented. Most routine colorectal cancer screening begins at the age of 36 and continues through age 22. However, your health care provider may recommend screening at an earlier age if you have risk factors for colon cancer. On a yearly basis, your health care provider may provide home test kits to check for hidden blood in the stool. Use of a small camera at the end of a tube to directly examine the colon (sigmoidoscopy or colonoscopy) can detect the earliest forms of colorectal cancer. Talk to your health care provider about this at age 62, when routine screening begins. Direct exam of the colon should be repeated every 5-10 years through age 106, unless early forms of precancerous polyps or small growths are found. 11. Screening for abdominal aortic aneurysm (AAA)  are recommended for persons over age 78 who have history of hypertensionor who are current or former smokers. 3. Talk with your health care provider about prostate cancer screening. 50. Testicular cancer screening is recommended for adult males. Screening includes self-exam, a health care provider exam, and other screening tests. Consult with your health care provider about any symptoms you have or any concerns you have about testicular cancer. 18. Use sunscreen. Apply sunscreen liberally and repeatedly throughout the day. You should seek shade when your shadow is shorter than you. Protect yourself by wearing long sleeves, pants, a wide-brimmed hat, and sunglasses year round, whenever you are outdoors. 19. Once a month, do a whole-body skin exam, using a mirror to look at the skin on your back. Tell your health care provider about new  moles, moles that have irregular borders, moles that are larger than a pencil eraser, or moles that have changed in shape or color. 20. Stay current with required vaccines (immunizations). 1. Influenza vaccine. All adults should be immunized every year. 2. Tetanus, diphtheria, and acellular pertussis (Td, Tdap) vaccine. An adult who has not previously received Tdap or who does not know his vaccine status should receive 1 dose of Tdap. This initial dose should be followed by tetanus and diphtheria toxoids (Td) booster doses every 10 years. Adults with an unknown or incomplete history of completing a 3-dose immunization series with Td-containing vaccines should begin or complete a primary immunization series including a Tdap dose. Adults should receive a Td booster every 10 years. 3. Zoster vaccine. One dose is recommended for adults aged 27 years or older unless certain conditions are present. 4.  5. Pneumococcal 13-valent conjugate (PCV13) vaccine. When indicated, a person who is uncertain of his immunization history and has no record of immunization should receive the PCV13 vaccine. An adult aged 34 years or older who has certain medical conditions and has not been previously immunized should  receive 1 dose of PCV13 vaccine. This PCV13 should be followed with a dose of pneumococcal polysaccharide (PPSV23) vaccine. The PPSV23 vaccine dose should be obtained at least 8 weeks after the dose of PCV13 vaccine. An adult aged 5 years or older who has certain medical conditions and previously received 1 or more doses of PPSV23 vaccine should receive 1 dose of PCV13. The PCV13 vaccine dose should be obtained 1 or more years after the last PPSV23 vaccine dose. 6.  7. Pneumococcal polysaccharide (PPSV23) vaccine. When PCV13 is also indicated, PCV13 should be obtained first. All adults aged 55 years and older should be immunized. An adult younger than age 45 years who has certain medical conditions should be  immunized. Any person who resides in a nursing home or long-term care facility should be immunized. An adult smoker should be immunized. People with an immunocompromised condition and certain other conditions should receive both PCV13 and PPSV23 vaccines. People with human immunodeficiency virus (HIV) infection should be immunized as soon as possible after diagnosis. Immunization during chemotherapy or radiation therapy should be avoided. Routine use of PPSV23 vaccine is not recommended for American Indians, [place] Natives, or people younger than 65 years unless there are medical conditions that require PPSV23 vaccine. When indicated, people who have unknown immunization and have no record of immunization should receive PPSV23 vaccine. One-time revaccination 5 years after the first dose of PPSV23 is recommended for people aged 19-64 years who have chronic kidney failure, nephrotic syndrome, asplenia, or immunocompromised conditions. People who received 1-2 doses of PPSV23 before age 63 years should receive another dose of PPSV23 vaccine at age 56 years or later if at least 5 years have passed since the previous dose. Doses of PPSV23 are not needed for people immunized with PPSV23 at or after age 66 years. 8. Hepatitis A vaccine. Adults who wish to be protected from this disease, have certain high-risk conditions, work with hepatitis A-infected animals, work in hepatitis A research labs, or travel to or work in countries with a high rate of hepatitis A should be immunized. Adults who were previously unvaccinated and who anticipate close contact with an international adoptee during the first 60 days after arrival in the [place] from a country with a high rate of hepatitis A should be immunized. 9. Hepatitis B vaccine. Adults should be immunized if they wish to be protected from this disease, have certain high-risk conditions, may be exposed to blood or other infectious body fluids, are household contacts or  sex partners of hepatitis B positive people, are clients or workers in certain care facilities, or travel to or work in countries with a high rate of hepatitis B.  Preventive Service / Frequency  Ages 49 to 38 1. Blood pressure check. 2. Lipid and cholesterol check. 3. Hepatitis C blood test.** / For any individual with known risks for hepatitis C. 4. Skin self-exam. / Monthly. 5. Influenza vaccine. / Every year. 6. Tetanus, diphtheria, and acellular pertussis (Tdap, Td) vaccine.** / Consult your health care provider. 1 dose of Td every 10 years. 7. HPV vaccine. / 3 doses over 6 months, if 73 or younger. 8. Measles, mumps, rubella (MMR) vaccine.** / You need at least 1 dose of MMR if you were born in 1957 or later. You may also need a second dose. 9. Pneumococcal 13-valent conjugate (PCV13) vaccine.** / Consult your health care provider. 10. Pneumococcal polysaccharide (PPSV23) vaccine.** / 1 to 2 doses if you smoke cigarettes or if you have certain  conditions. 11. Meningococcal vaccine.** / 1 dose if you are age 6 to 22 years and a Market researcher living in a residence hall, or have one of several medical conditions. You may also need additional booster doses. 12. Hepatitis A vaccine.** / Consult your health care provider. 13. Hepatitis B vaccine.** / Consult your health care provider.  Keeping you healthy  Chlamydia, HIV, and other sexual transmitted disease- Get screened each year until the age of 26 then within three months of each new sexual partner.  Don't smoke- If you do smoke, ask your healthcare provider about quitting. For tips on how to quit, go to www.smokefree.gov or call 1-800-QUIT-NOW.  Be physically active- Exercise 5 days a week for at least 30 minutes.  If you are not already physically active start slow and gradually work up to 30 minutes of moderate physical activity.  Examples of moderate activity include walking briskly, mowing the yard, dancing, swimming  bicycling, etc.  Eat a healthy diet- Eat a variety of healthy foods such as fruits, vegetables, low fat milk, low fat cheese, yogurt, lean meats, poultry, fish, beans, tofu, etc.  For more information on healthy eating, go to www.thenutritionsource.org  Drink alcohol in moderation- Limit alcohol intake two drinks or less a day.  Never drink and drive.  Dentist- Brush and floss teeth twice daily; visit your dentis twice a year.  Depression-Your emotional health is as important as your physical health.  If you're feeling down, losing interest in things you normally enjoy please talk with your healthcare provider.  Gun Safety- If you keep a gun in your home, keep it unloaded and with the safety lock on.  Bullets should be stored separately.  Helmet use- Always wear a helmet when riding a motorcycle, bicycle, rollerblading or skateboarding.  Safe sex- If you may be exposed to a sexually transmitted infection, use a condom  Seat belts- Seat bels can save your life; always wear one.  Smoke/Carbon Monoxide detectors- These detectors need to be installed on the appropriate level of your home.  Replace batteries at least once a year.  Skin Cancer- When out in the sun, cover up and use sunscreen SPF 15 or higher.  Violence- If anyone is threatening or hurting you, please tell your healthcare provider.

## 2015-02-24 NOTE — Progress Notes (Signed)
Complete Physical  Assessment and Plan: 1. Dysplastic nevus No abnormal moles at this time, continue monitoring, wear sunscreen  2. Hyperlipidemia  check lipids, decrease fatty foods, increase activity.  - CBC with Differential/Platelet - BASIC METABOLIC PANEL WITH GFR - Hepatic function panel - TSH - Lipid panel  3. Anxiety state continue medications, stress management techniques discussed, increase water, good sleep hygiene discussed, increase exercise, and increase veggies.   4. Right bundle branch block No symptoms, normal cardio  5. Retinal detachment, bilateral S/p surgery, better, continue eye appointments  6. Routine general medical examination at a health care facility normal  7. Anemia, unspecified anemia type - Iron and TIBC - Ferritin - Vitamin B12  8. Screening for diabetes mellitus - Hemoglobin A1c  9. Medication management - Magnesium  10. Vitamin D deficiency - VITAMIN D 25 Hydroxy (Vit-D Deficiency, Fractures)  11. Screening for blood or protein in urine - Urinalysis, Routine w reflex microscopic (not at [facility]) - Microalbumin / creatinine urine ratio  12. Other fatigue - Testosterone  Discussed med's effects and SE's. Screening labs and tests as requested with regular follow-up as recommended. Over 40 minutes of exam, counseling, chart review and critical decision making was performed  HPI Patient presents for a complete physical.   His blood pressure has been controlled at home, today their BP is BP: 110/70 mmHg He does workout, normally runs but has been doing deer hunting. He denies chest pain, shortness of breath, dizziness.  Still smoking but decreasing significantly, less than 10 a day.  He is not on cholesterol medication and denies myalgias. His cholesterol is at goal. The cholesterol last visit was:   Lab Results  Component Value Date   CHOL 154 08/31/2014   HDL 51 08/31/2014   LDLCALC 78 08/31/2014   TRIG 123 08/31/2014    CHOLHDL 3.0 08/31/2014  He has anxiety, and is on celexa.   Last A1C in the office was:  Lab Results  Component Value Date   HGBA1C 5.5 01/28/2014  Patient is on Vitamin D supplement.   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   Current Medications:  Current Outpatient Prescriptions on File Prior to Visit  Medication Sig Dispense Refill  . citalopram (CELEXA) 40 MG tablet Take 1 tablet (40 mg total) by mouth daily. 30 tablet 2  . Multiple Vitamins-Minerals (MULTIVITAMIN WITH MINERALS) tablet Take 1 tablet by mouth daily.     No current facility-administered medications on file prior to visit.   Health Maintenance:  Immunization History  Administered Date(s) Administered  . PPD Test 01/28/2014  . Tdap 03/17/2008    Tetanus: 2010 Pneumovax: N/A Prevnar 13: N/A Flu vaccine: declines Zostavax: N/A DEXA: Colonoscopy:  EGD: Eye Exam:Glasses, Dr. Nicki Reaper, yearly [place] dentist  Patient Care Team: Unk Pinto, MD as PCP - General (Internal Medicine) Macarthur Critchley, OD as Referring Physician (Optometry) Burnell Blanks, MD as Consulting Physician (Cardiology) Drusilla Kanner, MD as Referring Physician (Dermatology)  Allergies: No Known Allergies Medical History:  Past Medical History  Diagnosis Date  . Hyperlipidemia   . Anemia    Surgical History:  Past Surgical History  Procedure Laterality Date  . Skin biopsy Left     ankle, benign  . Retinal detachment surgery Right     about 2003 with buckle repair  Family History:  Family History  Problem Relation Age of Onset  . Hyperlipidemia Mother   . Heart disease Mother   . Hyperlipidemia Father   . Alcohol abuse Father   . Hypertension Father   . Cancer Other     lung, prostate  . Cancer Maternal Grandfather    Social History:   Social History   Substance Use Topics  . Smoking status: Current Some Day Smoker  . Smokeless tobacco: Never Used  . Alcohol Use: 0.0 oz/week    0 Standard drinks or equivalent per week     Comment: 1 qd   Review of Systems:  Review of Systems  Constitutional: Positive for malaise/fatigue. Negative for fever, chills, weight loss and diaphoresis.  HENT: Negative.   Eyes: Negative.   Respiratory: Negative.  Negative for cough and shortness of breath.   Cardiovascular: Negative.  Negative for chest pain.  Gastrointestinal: Negative.   Genitourinary: Negative.   Musculoskeletal: Negative.   Skin: Negative.   Neurological: Negative.  Negative for weakness.  Endo/Heme/Allergies: Negative.   Psychiatric/Behavioral: Negative.  Negative for depression.    Physical Exam: Estimated body mass index is 26.02 kg/(m^2) as calculated from the following:   Height as of this encounter: 5' 10.5" (1.791 m).   Weight as of this encounter: 184 lb (83.462 kg). BP 110/70 mmHg  Pulse 68  Temp(Src) 97.9 F (36.6 C) (Temporal)  Resp 16  Ht 5' 10.5" (1.791 m)  Wt 184 lb (83.462 kg)  BMI 26.02 kg/m2  SpO2 97% General Appearance: Well nourished, in no apparent distress.  Eyes: PERRLA, EOMs, conjunctiva no swelling or erythema, normal fundi and vessels.  Sinuses: No Frontal/maxillary tenderness  ENT/Mouth: Ext aud canals clear, normal light reflex with TMs without erythema, bulging. Good dentition. No erythema, swelling, or exudate on post pharynx. Tonsils not swollen or erythematous. Hearing normal.  Neck: Supple, thyroid normal. No bruits  Respiratory: Respiratory effort normal, BS equal bilaterally without rales, rhonchi, wheezing or stridor.  Cardio: RRR without murmurs, rubs or gallops. Brisk peripheral pulses without edema.  Chest: symmetric, with normal excursions and percussion.  Abdomen: Soft, nontender, no guarding, rebound, hernias, masses, or organomegaly.  Lymphatics: Non tender without lymphadenopathy.   Genitourinary: defer next year Musculoskeletal: Full ROM all peripheral extremities,5/5 strength, and normal gait.  Skin: Warm, dry without rashes, lesions, ecchymosis. Neuro: Cranial nerves intact, reflexes equal bilaterally. Normal muscle tone, no cerebellar symptoms. Sensation intact.  Psych: Awake and oriented X 3, normal affect, Insight and Judgment appropriate.   EKG: defer AORTA SCAN: defer  Vicie Mutters 3:06 PM [facility]

## 2015-02-25 LAB — HEPATIC FUNCTION PANEL
ALBUMIN: 4.5 g/dL (ref 3.6–5.1)
ALK PHOS: 59 U/L (ref 40–115)
ALT: 15 U/L (ref 9–46)
AST: 14 U/L (ref 10–40)
BILIRUBIN DIRECT: 0.2 mg/dL (ref <=0.2)
BILIRUBIN INDIRECT: 0.4 mg/dL (ref 0.2–1.2)
BILIRUBIN TOTAL: 0.6 mg/dL (ref 0.2–1.2)
Total Protein: 6.8 g/dL (ref 6.1–8.1)

## 2015-02-25 LAB — CBC WITH DIFFERENTIAL/PLATELET
BASOS ABS: 0.1 10*3/uL (ref 0.0–0.1)
Basophils Relative: 1 % (ref 0–1)
Eosinophils Absolute: 0.3 10*3/uL (ref 0.0–0.7)
Eosinophils Relative: 4 % (ref 0–5)
HEMATOCRIT: 47.8 % (ref 39.0–52.0)
Hemoglobin: 16.6 g/dL (ref 13.0–17.0)
LYMPHS ABS: 2.2 10*3/uL (ref 0.7–4.0)
LYMPHS PCT: 31 % (ref 12–46)
MCH: 32.9 pg (ref 26.0–34.0)
MCHC: 34.7 g/dL (ref 30.0–36.0)
MCV: 94.7 fL (ref 78.0–100.0)
MPV: 9.7 fL (ref 8.6–12.4)
Monocytes Absolute: 0.6 10*3/uL (ref 0.1–1.0)
Monocytes Relative: 8 % (ref 3–12)
Neutro Abs: 4 10*3/uL (ref 1.7–7.7)
Neutrophils Relative %: 56 % (ref 43–77)
Platelets: 285 10*3/uL (ref 150–400)
RBC: 5.05 MIL/uL (ref 4.22–5.81)
RDW: 14.4 % (ref 11.5–15.5)
WBC: 7.2 10*3/uL (ref 4.0–10.5)

## 2015-02-25 LAB — LIPID PANEL
CHOL/HDL RATIO: 2.3 ratio (ref <=5.0)
Cholesterol: 148 mg/dL (ref 125–200)
HDL: 65 mg/dL (ref >=40)
LDL CALC: 71 mg/dL (ref <130)
Triglycerides: 59 mg/dL (ref <150)
VLDL: 12 mg/dL (ref <30)

## 2015-02-25 LAB — URINALYSIS, ROUTINE W REFLEX MICROSCOPIC
BILIRUBIN URINE: NEGATIVE
GLUCOSE, UA: NEGATIVE
HGB URINE DIPSTICK: NEGATIVE
Leukocytes, UA: NEGATIVE
Nitrite: NEGATIVE
PROTEIN: NEGATIVE
Specific Gravity, Urine: 1.026 (ref 1.001–1.035)
pH: 6 (ref 5.0–8.0)

## 2015-02-25 LAB — BASIC METABOLIC PANEL WITH GFR
BUN: 12 mg/dL (ref 7–25)
CHLORIDE: 102 mmol/L (ref 98–110)
CO2: 24 mmol/L (ref 20–31)
Calcium: 9.5 mg/dL (ref 8.6–10.3)
Creat: 0.91 mg/dL (ref 0.60–1.35)
GFR, Est African American: >89 mL/min (ref >=60)
Glucose, Bld: 85 mg/dL (ref 65–99)
Potassium: 4.1 mmol/L (ref 3.5–5.3)
Sodium: 137 mmol/L (ref 135–146)

## 2015-02-25 LAB — IRON AND TIBC
%SAT: 28 % (ref 15–60)
Iron: 91 ug/dL (ref 50–180)
TIBC: 321 ug/dL (ref 250–425)
UIBC: 230 ug/dL (ref 125–400)

## 2015-02-25 LAB — MICROALBUMIN / CREATININE URINE RATIO
Creatinine, Urine: 265 mg/dL (ref 20–370)
Microalb Creat Ratio: 4 mcg/mg creat (ref <30)
Microalb, Ur: 1 mg/dL

## 2015-02-25 LAB — MAGNESIUM: Magnesium: 2 mg/dL (ref 1.5–2.5)

## 2015-02-25 LAB — HEMOGLOBIN A1C
Hgb A1c MFr Bld: 5.7 % — ABNORMAL HIGH (ref <5.7)
Mean Plasma Glucose: 117 mg/dL — ABNORMAL HIGH (ref <117)

## 2015-02-25 LAB — TESTOSTERONE: TESTOSTERONE: 430 ng/dL (ref 300–890)

## 2015-02-25 LAB — VITAMIN B12: Vitamin B-12: 371 pg/mL (ref 211–911)

## 2015-02-25 LAB — VITAMIN D 25 HYDROXY (VIT D DEFICIENCY, FRACTURES): Vit D, 25-Hydroxy: 37 ng/mL (ref 30–100)

## 2015-02-25 LAB — FERRITIN: FERRITIN: 124 ng/mL (ref 22–322)

## 2015-02-25 LAB — TSH: TSH: 1.841 u[IU]/mL (ref 0.350–4.500)

## 2015-03-04 ENCOUNTER — Other Ambulatory Visit: Payer: Self-pay | Admitting: Internal Medicine

## 2015-04-05 ENCOUNTER — Other Ambulatory Visit: Payer: Self-pay | Admitting: *Deleted

## 2015-04-05 ENCOUNTER — Other Ambulatory Visit: Payer: Self-pay | Admitting: Internal Medicine

## 2015-04-05 MED ORDER — CITALOPRAM HYDROBROMIDE 40 MG PO TABS: ORAL_TABLET | ORAL | Status: DC

## 2015-09-25 ENCOUNTER — Other Ambulatory Visit: Payer: Self-pay | Admitting: Internal Medicine

## 2015-12-29 ENCOUNTER — Other Ambulatory Visit: Payer: Self-pay | Admitting: *Deleted

## 2015-12-29 MED ORDER — CITALOPRAM HYDROBROMIDE 40 MG PO TABS: 40.0000 mg | ORAL_TABLET | Freq: Every day | ORAL | 1 refills | Status: DC

## 2016-02-13 ENCOUNTER — Encounter: Payer: Self-pay | Admitting: Physician Assistant

## 2016-02-28 ENCOUNTER — Encounter: Payer: Self-pay | Admitting: Physician Assistant

## 2016-04-05 ENCOUNTER — Ambulatory Visit (INDEPENDENT_AMBULATORY_CARE_PROVIDER_SITE_OTHER): Payer: PRIVATE HEALTH INSURANCE | Admitting: Physician Assistant

## 2016-04-05 ENCOUNTER — Encounter: Payer: Self-pay | Admitting: Physician Assistant

## 2016-04-05 VITALS — BP 122/70 | HR 77 | Temp 98.2°F | Resp 16 | Ht 72.0 in | Wt 198.0 lb

## 2016-04-05 DIAGNOSIS — Z0001 Encounter for general adult medical examination with abnormal findings: Secondary | ICD-10-CM | POA: Diagnosis not present

## 2016-04-05 DIAGNOSIS — D239 Other benign neoplasm of skin, unspecified: Secondary | ICD-10-CM

## 2016-04-05 DIAGNOSIS — N401 Enlarged prostate with lower urinary tract symptoms: Secondary | ICD-10-CM | POA: Diagnosis not present

## 2016-04-05 DIAGNOSIS — Z79899 Other long term (current) drug therapy: Secondary | ICD-10-CM

## 2016-04-05 DIAGNOSIS — Z136 Encounter for screening for cardiovascular disorders: Secondary | ICD-10-CM

## 2016-04-05 DIAGNOSIS — L570 Actinic keratosis: Secondary | ICD-10-CM

## 2016-04-05 DIAGNOSIS — F411 Generalized anxiety disorder: Secondary | ICD-10-CM

## 2016-04-05 DIAGNOSIS — R6889 Other general symptoms and signs: Secondary | ICD-10-CM | POA: Diagnosis not present

## 2016-04-05 DIAGNOSIS — Z0000 Encounter for general adult medical examination without abnormal findings: Secondary | ICD-10-CM

## 2016-04-05 DIAGNOSIS — I4510 Unspecified right bundle-branch block: Secondary | ICD-10-CM

## 2016-04-05 DIAGNOSIS — H3323 Serous retinal detachment, bilateral: Secondary | ICD-10-CM

## 2016-04-05 DIAGNOSIS — Z1389 Encounter for screening for other disorder: Secondary | ICD-10-CM

## 2016-04-05 DIAGNOSIS — E785 Hyperlipidemia, unspecified: Secondary | ICD-10-CM | POA: Diagnosis not present

## 2016-04-05 DIAGNOSIS — R351 Nocturia: Secondary | ICD-10-CM

## 2016-04-05 DIAGNOSIS — R7309 Other abnormal glucose: Secondary | ICD-10-CM

## 2016-04-05 DIAGNOSIS — E559 Vitamin D deficiency, unspecified: Secondary | ICD-10-CM | POA: Diagnosis not present

## 2016-04-05 LAB — CBC WITH DIFFERENTIAL/PLATELET
BASOS PCT: 1 %
Basophils Absolute: 66 cells/uL (ref 0–200)
Eosinophils Absolute: 198 cells/uL (ref 15–500)
Eosinophils Relative: 3 %
HEMATOCRIT: 47.1 % (ref 38.5–50.0)
HEMOGLOBIN: 16.3 g/dL (ref 13.2–17.1)
LYMPHS ABS: 1518 {cells}/uL (ref 850–3900)
Lymphocytes Relative: 23 %
MCH: 33.6 pg — ABNORMAL HIGH (ref 27.0–33.0)
MCHC: 34.6 g/dL (ref 32.0–36.0)
MCV: 97.1 fL (ref 80.0–100.0)
MONO ABS: 528 {cells}/uL (ref 200–950)
MPV: 9.8 fL (ref 7.5–12.5)
Monocytes Relative: 8 %
Neutro Abs: 4290 cells/uL (ref 1500–7800)
Neutrophils Relative %: 65 %
Platelets: 276 10*3/uL (ref 140–400)
RBC: 4.85 MIL/uL (ref 4.20–5.80)
RDW: 13.5 % (ref 11.0–15.0)
WBC: 6.6 10*3/uL (ref 3.8–10.8)

## 2016-04-05 LAB — TSH: TSH: 1.22 mIU/L (ref 0.40–4.50)

## 2016-04-05 NOTE — Patient Instructions (Signed)
Actinic Keratosis An actinic keratosis is a precancerous growth on the skin. This means that it could develop into skin cancer if it is not treated. About 1% of these growths (actinic keratoses) turn into skin cancer within one year if they are not treated. It is important to have all of these growths evaluated to determine the best treatment approach. What are the causes? This condition is caused by getting too much ultraviolet (UV) radiation from the sun or other UV light sources. What increases the risk? The following factors may make you more likely to develop this condition:  Having light-colored skin and blue eyes.  Having blonde or red hair.  Spending a lot of time in the sun.  Inadequate skin protection when outdoors. This may include:  Not using sunscreen properly.  Not covering up skin that is exposed to sunlight.  Aging. The risk of developing an actinic keratosis increases with age. What are the signs or symptoms? Actinic keratoses look like scaly, rough spots of skin.They can be as small as a pinhead or as big as a quarter. They may itch, hurt, or feel sensitive. In most cases, the growths become red. In some cases, they may be skin-colored, light tan, dark tan, pink, or a combination of any of these colors. There may be a small piece of pink or gray skin (skin tag) growing from the actinic keratosis. In some cases, it may be easier to notice actinic keratoses by feeling them, rather than seeing them. Actinic keratoses appear most often on areas of skin that get a lot of sun exposure, including the scalp, face, ears, lips, upper back, forearms, and the backs of the hands. Sometimes, actinic keratoses disappear, but many reappear a few days to a few weeks later. How is this diagnosed? This condition is usually diagnosed with a physical exam. A tissue sample may be removed from the actinic keratosis and examined under a microscope (biopsy). How is this treated?   Treatment for  this condition may include:  Scraping off the actinic keratosis (curettage).  Freezing the actinic keratosis with liquid nitrogen (cryosurgery). This causes the growth to eventually fall off the skin.  Applying medicated creams or gels to destroy the cells in the growth.  Applying chemicals to the actinic keratosis to make the outer layers of skin peel off (chemical peel).  Photodynamic therapy. In this procedure, medicated cream is applied to the actinic keratosis. This cream increases your skin's sensitivity to light. Then, a strong light is aimed at the actinic keratosis to destroy cells in the growth. Follow these instructions at home: Skin care  Apply cool, wet cloths (cool compresses) to the affected areas.  Do not scratch your skin.  Check your skin regularly for any growths, especially growths that:  Start to itch or bleed.  Change in size, shape, or color. Caring for the treated area  Keep the treated area clean and dry as told by your health care provider.  Do not apply any medicine, cream, or lotion to the treated area unless your health care provider tells you to do that.  Do not pick at blisters or try to break them open. This can cause infection and scarring.  If you have red or irritated skin after treatment, follow instructions from your health care provider about how to take care of the treated area. Make sure you:  Wash your hands with soap and water before you change your bandage (dressing). If soap and water are not available, use hand  sanitizer.  Change your dressing as told by your health care provider.  If you have red or irritated skin after treatment, check your treated area every day for signs of infection. Check for:  Swelling, pain, or more redness.  Fluid or blood.  Warmth.  Pus or a bad smell. General instructions  Take over-the-counter and prescription medicines only as told by your health care provider.  Return to your normal  activities as told by your health care provider. Ask your health care provider what activities are safe for you.  Do not use any tobacco products, such as cigarettes, chewing tobacco, and e-cigarettes. If you need help quitting, ask your health care provider.  Have a skin exam done every year by a health care provider who is a skin conditions specialist (dermatologist).  Keep all follow-up visits as told by your health care provider. This is important. How is this prevented?  Do not get sunburns.  Try to avoid the sun between 10:00 a.m. and 4:00 p.m. This is when the UV light is the strongest.  Use a sunscreen or sunblock with SPF 30 (sun protection factor 30) or greater.  Apply sunscreen before you are exposed to sunlight, and reapply periodically as often as directed by the instructions on the sunscreen container.  Always wear sunglasses that have UV protection, and always wear hats and clothing to protect your skin from sunlight.  When possible, avoid medicines that increase your sensitivity to sunlight. These include:  Certain antibiotic medicines.  Certain water pills (diuretics).  Certain prescription medicines that are used to treat acne (retinoids).  Do not use tanning beds or other indoor tanning devices. Contact a health care provider if:  You notice any changes or new growths on your skin.  You have swelling, pain, or more redness around your treated area.  You have fluid or blood coming from your treated area.  Your treated area feels warm to the touch.  You have pus or a bad smell coming from your treated area.  You have a fever.  You have a blister that becomes large and painful. This information is not intended to replace advice given to you by your health care provider. Make sure you discuss any questions you have with your health care provider. Document Released: 05/25/2008 Document Revised: 10/28/2015 Document Reviewed: 11/06/2014 Elsevier Interactive  Patient Education  2017 [place].   Squamous Cell Carcinoma Squamous cell carcinoma is a common form of skin cancer. It begins in the squamous cells in the outer layer of the skin (epidermis). It occurs most often in parts of the body that are frequently exposed to the sun, such as the face, lips, neck, arms, legs, and hands. However, this condition can occur anywhere on the body, including the inside of the mouth, sites of long-term (chronic) scarring, and the anus. If squamous cell carcinoma is treated soon enough, it rarely spreads to other areas of the body (metastasizes). If it is not treated, it can destroy nearby tissues. In rare cases, it can spread to other areas. What are the causes? This condition is usually caused by exposure to ultraviolet (UV) light. UV light may come from the sun or from tanning beds. Other causes include:  Exposure to arsenic.  Exposure to radiation.  Exposure to toxic tars and oils.  Certain genetic conditions, such as xeroderma pigmentosum. What increases the risk? This condition is more likely to develop in:  People who are older than 41 years of age.  People who  have fair skin (light complexion).  People who have blonde or red hair.  People who have blue, green, or gray eyes.  People who have childhood freckling.  People who have had sun exposure over long periods of time, especially during childhood.  People who have had repeated sunburns.  People who have a weakened immune system.  People who have been exposed to certain chemicals, such as tar, soot, and arsenic.  People who have chronic inflammatory conditions.  People who have chronic infections.  People who have an HPV (human papillomavirus) infection.  People who have conditions that cause chronic scarring. These can include burn scars, chronic ulcers, heat (thermal) injuries, and radiation.  People who have had psoralen and ultraviolet A (PUVA) treatments.  People who  smoke.  People who use tanning beds. What are the signs or symptoms? This condition often starts as small pink or brown growths on the skin. The growths have a rough surface that may feel like sandpaper. In some cases, the growths are easier to feel than to see. The growths may develop into a sore that does not heal. How is this diagnosed? This condition may be diagnosed with:  A physical exam.  Removal of a tissue sample to be examined under a microscope (biopsy). How is this treated? Treatment for this condition involves removing the cancerous tissue. The method that is used for this depends on the size and location of the tumors, as well as your overall health. Possible treatments include:  Mohs surgery. In this procedure, the cancerous skin cells are removed layer by layer until all of the tumor has been removed.  Surgical removal (excision) of the tumor. This involves removing the entire tumor and a small amount of normal skin that surrounds it.  Cryosurgery. This involves freezing the tumor with liquid nitrogen.  Plastic surgery. The tumor is removed, and healthy skin from another part of the body is used to cover the wound. This may be done for large tumors that are in areas where it is not possible to stretch the nearby skin to sew the edges of the wound together.  Radiation. This may be used for tumors on the face.  Photodynamic therapy. A chemical cream is applied to the skin, and light exposure is used to activate the chemical.  Electrodesiccation and curettage. This involves alternately scraping and burning the tumor while using an electric current to control bleeding. Follow these instructions at home:  Avoid unprotected sun exposure.  Do self-exams as told by your health care provider. Look for new spots or changes in your skin.  Keep all follow-up visits as told by your health care provider. This is important.  Do not use tobacco products, including cigarettes,  chewing tobacco, or e-cigarettes. If you need help quitting, ask your health care provider. How is this prevented?  Avoid the sun when it is the strongest. This is usually between 10:00 a.m. and 4:00 p.m.  When you are out in the sun, use a sunscreen that has a sun protection factor (SPF) of at least 71.  Apply sunscreen at least 30 minutes before exposure to the sun.  Reapply sunscreen every 2-4 hours while you are outside. Also reapply it after swimming and after excessive sweating.  Always wear hats, protective clothing, and UV-blocking sunglasses when you are outdoors.  Do not use tanning beds. Contact a health care provider if:  You notice any new growths or any changes in your skin.  You have had a squamous cell  carcinoma tumor removed and you notice a new growth in the same location. This information is not intended to replace advice given to you by your health care provider. Make sure you discuss any questions you have with your health care provider. Document Released: 09/02/2002 Document Revised: 10/25/2015 Document Reviewed: 06/21/2014 Elsevier Interactive Patient Education  2017 [employer].

## 2016-04-05 NOTE — Progress Notes (Signed)
Complete Physical  Assessment and Plan: Dysplastic nevus No abnormal moles at this time, continue monitoring, wear sunscreen   Hyperlipidemia  check lipids, decrease fatty foods, increase activity.  - CBC with Differential/Platelet - BASIC METABOLIC PANEL WITH GFR - Hepatic function panel - TSH - Lipid panel   Anxiety state continue medications, stress management techniques discussed, increase water, good sleep hygiene discussed, increase exercise, and increase veggies.   Right bundle branch block No symptoms, normal cardio  Retinal detachment, bilateral S/p surgery, better, continue eye appointments   Routine general medical examination at a health care facility normal   Anemia, unspecified anemia type - Iron and TIBC - Ferritin - Vitamin B12  Screening for diabetes mellitus - Hemoglobin A1c  Medication management - Magnesium  Vitamin D deficiency - VITAMIN D 25 Hydroxy (Vit-D Deficiency, Fractures)   Screening for blood or protein in urine - Urinalysis, Routine w reflex microscopic (not at [facility]) - Microalbumin / creatinine urine ratio  Actinic keratosis 3 freeze and thaw, if not better will schedule for removal   Discussed med's effects and SE's. Screening labs and tests as requested with regular follow-up as recommended. Over 40 minutes of exam, counseling, chart review and critical decision making was performed  HPI Patient presents for a complete physical.   His blood pressure has been controlled at home, today their BP is BP: 122/70 He does workout, normally runs but has been doing deer hunting. He denies chest pain, shortness of breath, dizziness.  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 Daughter is 5.  Still smoking but decreasing significantly, less than 10 a day.  He is not on cholesterol medication and denies myalgias. His cholesterol is at goal. The cholesterol last visit was:   Lab  Results  Component Value Date   CHOL 148 02/24/2015   HDL 65 02/24/2015   LDLCALC 71 02/24/2015   TRIG 59 02/24/2015   CHOLHDL 2.3 02/24/2015  He has anxiety, and is on celexa.   Last A1C in the office was:  Lab Results  Component Value Date   HGBA1C 5.7 (H) 02/24/2015  Patient is on Vitamin D supplement.   Lab Results  Component Value Date   VD25OH 37 02/24/2015    Last PSA was: Lab Results  Component Value Date   PSA 0.62 01/28/2014   BMI is Body mass index is 26.85 kg/m., he is working on diet and exercise. Wt Readings from Last 3 Encounters:  04/05/16 198 lb (89.8 kg)  02/24/15 184 lb (83.5 kg)  08/31/14 202 lb (91.6 kg)   Current Medications:  Current Outpatient Prescriptions on File Prior to Visit  Medication Sig Dispense Refill  . citalopram (CELEXA) 40 MG tablet Take 1 tablet (40 mg total) by mouth daily. 90 tablet 1  . Multiple Vitamins-Minerals (MULTIVITAMIN WITH MINERALS) tablet Take 1 tablet by mouth daily.     No current facility-administered medications on file prior to visit.    Health Maintenance:  Immunization History  Administered Date(s) Administered  . PPD Test 01/28/2014  . Tdap 03/17/2008    Tetanus: 2010 Pneumovax: N/A Prevnar 13: N/A Flu vaccine: declines Zostavax: N/A DEXA: Colonoscopy:  EGD: Eye Exam:Glasses, Dr. Nicki Reaper, yearly [place] dentist  Patient Care Team: Unk Pinto, MD as PCP - General (Internal Medicine) Macarthur Critchley, [place] as Referring Physician (Optometry) Burnell Blanks, MD as Consulting Physician (Cardiology) Drusilla Kanner, MD as Referring Physician (Dermatology)  Medical History:  Past  Medical History:  Diagnosis Date  . Anemia   . Hyperlipidemia    Allergies No Known Allergies  SURGICAL HISTORY He  has a past surgical history that includes Skin biopsy (Left) and Retinal detachment surgery (Right). FAMILY HISTORY His family history includes Alcohol abuse in his father; Cancer in his maternal  grandfather and other; Heart disease in his mother; Hyperlipidemia in his father and mother; Hypertension in his father. SOCIAL HISTORY He  reports that he has been smoking.  He has never used smokeless tobacco. He reports that he drinks alcohol. He reports that he does not use drugs.  Review of Systems:  Review of Systems  Constitutional: Negative for chills, diaphoresis, fever, malaise/fatigue and weight loss.  HENT: Negative.   Eyes: Negative.   Respiratory: Negative.  Negative for cough and shortness of breath.   Cardiovascular: Negative.  Negative for chest pain.  Gastrointestinal: Negative.   Genitourinary: Negative.   Musculoskeletal: Negative.   Skin: Negative.   Neurological: Negative.  Negative for weakness.  Endo/Heme/Allergies: Negative.   Psychiatric/Behavioral: Negative.  Negative for depression.    Physical Exam: Estimated body mass index is 26.85 kg/m as calculated from the following:   Height as of this encounter: 6' (1.829 m).   Weight as of this encounter: 198 lb (89.8 kg). BP 122/70   Pulse 77   Temp 98.2 F (36.8 C)   Resp 16   Ht 6' (1.829 m)   Wt 198 lb (89.8 kg)   SpO2 97%   BMI 26.85 kg/m  General Appearance: Well nourished, in no apparent distress.  Eyes: PERRLA, EOMs, conjunctiva no swelling or erythema, normal fundi and vessels.  Sinuses: No Frontal/maxillary tenderness  ENT/Mouth: Ext aud canals clear, normal light reflex with TMs without erythema, bulging. Good dentition. No erythema, swelling, or exudate on post pharynx. Tonsils not swollen or erythematous. Hearing normal.  Neck: Supple, thyroid normal. No bruits  Respiratory: Respiratory effort normal, BS equal bilaterally without rales, rhonchi, wheezing or stridor.  Cardio: RRR without murmurs, rubs or gallops. Brisk peripheral pulses without edema.  Chest: symmetric, with normal excursions and percussion.  Abdomen: Soft, nontender, no guarding, rebound, hernias, masses, or organomegaly.   Lymphatics: Non tender without lymphadenopathy.  Genitourinary: defer next year Musculoskeletal: Full ROM all peripheral extremities,5/5 strength, and normal gait.  Skin: left forearm erythematous nodule 4x72m. Warm, dry without rashes, lesions, ecchymosis. Neuro: Cranial nerves intact, reflexes equal bilaterally. Normal muscle tone, no cerebellar symptoms. Sensation intact.  Psych: Awake and oriented X 3, normal affect, Insight and Judgment appropriate.   EKG: IRBBB no ST changes AORTA SCAN: defer  AVicie Mutters2:23 PM [facility]

## 2016-04-06 LAB — URINALYSIS, ROUTINE W REFLEX MICROSCOPIC
Bilirubin Urine: NEGATIVE
Glucose, UA: NEGATIVE
Ketones, ur: NEGATIVE
LEUKOCYTES UA: NEGATIVE
Nitrite: NEGATIVE
PH: 6 (ref 5.0–8.0)
Protein, ur: NEGATIVE
SPECIFIC GRAVITY, URINE: 1.018 (ref 1.001–1.035)

## 2016-04-06 LAB — BASIC METABOLIC PANEL WITH GFR
BUN: 12 mg/dL (ref 7–25)
CO2: 21 mmol/L (ref 20–31)
Calcium: 10 mg/dL (ref 8.6–10.3)
Chloride: 103 mmol/L (ref 98–110)
Creat: 0.95 mg/dL (ref 0.60–1.35)
GLUCOSE: 92 mg/dL (ref 65–99)
Potassium: 4.1 mmol/L (ref 3.5–5.3)
Sodium: 137 mmol/L (ref 135–146)

## 2016-04-06 LAB — LIPID PANEL
CHOL/HDL RATIO: 2.6 ratio (ref <5.0)
Cholesterol: 192 mg/dL (ref <200)
HDL: 74 mg/dL (ref >40)
LDL CALC: 96 mg/dL (ref <100)
TRIGLYCERIDES: 110 mg/dL (ref <150)
VLDL: 22 mg/dL (ref <30)

## 2016-04-06 LAB — URINALYSIS, MICROSCOPIC ONLY
Bacteria, UA: NONE SEEN [HPF]
Casts: NONE SEEN [LPF]
Crystals: NONE SEEN [HPF]
RBC / HPF: NONE SEEN RBC/HPF (ref <=2)
Squamous Epithelial / HPF: NONE SEEN [HPF] (ref <=5)
WBC UA: NONE SEEN WBC/HPF (ref <=5)
Yeast: NONE SEEN [HPF]

## 2016-04-06 LAB — VITAMIN D 25 HYDROXY (VIT D DEFICIENCY, FRACTURES): Vit D, 25-Hydroxy: 30 ng/mL (ref 30–100)

## 2016-04-06 LAB — HEPATIC FUNCTION PANEL
ALK PHOS: 54 U/L (ref 40–115)
ALT: 13 U/L (ref 9–46)
AST: 15 U/L (ref 10–40)
Albumin: 4.6 g/dL (ref 3.6–5.1)
BILIRUBIN INDIRECT: 0.5 mg/dL (ref 0.2–1.2)
Bilirubin, Direct: 0.1 mg/dL (ref <=0.2)
TOTAL PROTEIN: 7.3 g/dL (ref 6.1–8.1)
Total Bilirubin: 0.6 mg/dL (ref 0.2–1.2)

## 2016-04-06 LAB — MAGNESIUM: MAGNESIUM: 2 mg/dL (ref 1.5–2.5)

## 2016-04-06 LAB — HEMOGLOBIN A1C
Hgb A1c MFr Bld: 5 % (ref <5.7)
Mean Plasma Glucose: 97 mg/dL

## 2016-04-06 LAB — URINE CULTURE: ORGANISM ID, BACTERIA: NO GROWTH

## 2016-04-06 LAB — PSA: PSA: 0.4 ng/mL (ref <=4.0)

## 2016-04-06 NOTE — Progress Notes (Signed)
Hector Bernard DOB: 02-11-1976  Needs appt For: recheck urine only 3 months lab only,

## 2016-04-06 NOTE — Progress Notes (Signed)
Pt aware of lab results & voiced understanding of those results. A message was sent to front office for: recheck urine only 3 months lab only,

## 2016-04-09 NOTE — Progress Notes (Signed)
Pt aware of lab results & voiced understanding of those results.

## 2016-04-09 NOTE — Progress Notes (Signed)
LVM for pt to return office call for LAB results.

## 2016-08-09 ENCOUNTER — Other Ambulatory Visit: Payer: Self-pay

## 2016-08-09 MED ORDER — CITALOPRAM HYDROBROMIDE 40 MG PO TABS: 40.0000 mg | ORAL_TABLET | Freq: Every day | ORAL | 1 refills | Status: DC

## 2016-08-21 ENCOUNTER — Other Ambulatory Visit (INDEPENDENT_AMBULATORY_CARE_PROVIDER_SITE_OTHER): Payer: No Typology Code available for payment source

## 2016-08-21 DIAGNOSIS — R319 Hematuria, unspecified: Secondary | ICD-10-CM

## 2016-08-22 LAB — URINALYSIS, ROUTINE W REFLEX MICROSCOPIC
Bilirubin Urine: NEGATIVE
Glucose, UA: NEGATIVE
Hgb urine dipstick: NEGATIVE
Ketones, ur: NEGATIVE
LEUKOCYTES UA: NEGATIVE
Nitrite: NEGATIVE
PROTEIN: NEGATIVE
Specific Gravity, Urine: 1.019 (ref 1.001–1.035)
pH: 7 (ref 5.0–8.0)

## 2017-03-13 ENCOUNTER — Encounter: Payer: Self-pay | Admitting: Physician Assistant

## 2017-04-08 ENCOUNTER — Encounter: Payer: Self-pay | Admitting: Physician Assistant

## 2017-04-08 NOTE — Progress Notes (Signed)
Complete Physical  Assessment and Plan: Dysplastic nevus No abnormal moles at this time, continue monitoring, wear sunscreen   Hyperlipidemia  check lipids, decrease fatty foods, increase activity.  - CBC with Differential/Platelet - BASIC METABOLIC PANEL WITH GFR - Hepatic function panel - TSH - Lipid panel   Anxiety state Will add on flexeril for sleep, if this does not help try wellbutrin, better sleep habits discussed, stress management techniques discussed, increase water, good sleep hygiene discussed, increase exercise, and increase veggies.   Right bundle branch block No symptoms, normal cardio  Retinal detachment, bilateral S/p surgery, better, continue eye appointments   Routine general medical examination at a health care facility 1 year  Medication management - Magnesium  Vitamin D deficiency - VITAMIN D 25 Hydroxy (Vit-D Deficiency, Fractures)   Discussed med's effects and SE's. Screening labs and tests as requested with regular follow-up as recommended. Over 40 minutes of exam, counseling, chart review and critical decision making was performed Future Appointments  Date Time Provider [place]  04/15/2018  1:45 PM Vicie Mutters, PA-C [facility] None    HPI Patient presents for a complete physical.   His blood pressure has been controlled at home, today their BP is BP: 120/84 He does workout, normally runs but has been doing deer hunting. He denies chest pain, shortness of breath, dizziness.  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 Daughter is 6. New job is stressing him out, he is still on 40 min of celexa.  He is working out, eating better. Not sleeping well but no panic attacks.  Still smoking but decreasing significantly, less than 10 a day.  He is not on cholesterol medication and denies myalgias. His cholesterol is at goal. The cholesterol last visit was:   Lab Results  Component  Value Date   CHOL 192 04/05/2016   HDL 74 04/05/2016   LDLCALC 96 04/05/2016   TRIG 110 04/05/2016   CHOLHDL 2.6 04/05/2016     Last A1C in the office was:  Lab Results  Component Value Date   HGBA1C 5.0 04/05/2016  Patient is on Vitamin D supplement.   Lab Results  Component Value Date   VD25OH 30 04/05/2016    Last PSA was: Lab Results  Component Value Date   PSA 0.4 04/05/2016   BMI is Body mass index is 26.93 kg/m., he is working on diet and exercise. Wt Readings from Last 3 Encounters:  04/09/17 195 lb 12.8 oz (88.8 kg)  04/05/16 198 lb (89.8 kg)  02/24/15 184 lb (83.5 kg)   Current Medications:  Current Outpatient Medications on File Prior to Visit  Medication Sig Dispense Refill  . citalopram (CELEXA) 40 MG tablet Take 1 tablet (40 mg total) by mouth daily. 90 tablet 1  . Multiple Vitamins-Minerals (MULTIVITAMIN WITH MINERALS) tablet Take 1 tablet by mouth daily.     No current facility-administered medications on file prior to visit.    Health Maintenance:  Immunization History  Administered Date(s) Administered  . PPD Test 01/28/2014  . Tdap 03/17/2008   Tetanus: 2010 Pneumovax: N/A Prevnar 13: N/A Flu vaccine: declines Zostavax: N/A DEXA: Colonoscopy:  EGD: Eye Exam:Glasses, Dr. Nicki Reaper, yearly [place] dentist  Patient Care Team: Unk Pinto, MD as PCP - General (Internal Medicine) Macarthur Critchley, [place] as Referring Physician (Optometry) Burnell Blanks, MD as Consulting Physician (Cardiology) Drusilla Kanner, MD as Referring Physician (Dermatology)  Medical History:  Past Medical History:  Diagnosis Date  . Anemia   . Hyperlipidemia   . Retinal detachment 04/04/2009   Qualifier: Diagnosis of  By: Orville Govern, CMA, Carol     Allergies No Known Allergies  SURGICAL HISTORY He  has a past surgical history that includes Skin biopsy (Left) and Retinal detachment surgery (Right).   FAMILY HISTORY His family history includes Alcohol abuse  in his father; Cancer in his maternal grandfather and other; Heart disease in his mother; Hyperlipidemia in his father and mother; Hypertension in his father.   SOCIAL HISTORY He  reports that he has been smoking.  he has never used smokeless tobacco. He reports that he drinks alcohol. He reports that he does not use drugs.  Review of Systems:  Review of Systems  Constitutional: Negative for chills, diaphoresis, fever, malaise/fatigue and weight loss.  HENT: Negative.   Eyes: Negative.   Respiratory: Negative.  Negative for cough and shortness of breath.   Cardiovascular: Negative.  Negative for chest pain.  Gastrointestinal: Negative.   Genitourinary: Negative.   Musculoskeletal: Negative.   Skin: Negative.   Neurological: Negative.  Negative for weakness.  Endo/Heme/Allergies: Negative.   Psychiatric/Behavioral: Negative for depression, hallucinations, memory loss, substance abuse and suicidal ideas. The patient is nervous/anxious and has insomnia.     Physical Exam: Estimated body mass index is 26.93 kg/m as calculated from the following:   Height as of this encounter: 5' 11.5" (1.816 m).   Weight as of this encounter: 195 lb 12.8 oz (88.8 kg). BP 120/84   Pulse 94   Temp 97.9 F (36.6 C)   Resp 14   Ht 5' 11.5" (1.816 m)   Wt 195 lb 12.8 oz (88.8 kg)   SpO2 97%   BMI 26.93 kg/m  General Appearance: Well nourished, in no apparent distress.  Eyes: PERRLA, EOMs, conjunctiva no swelling or erythema, normal fundi and vessels.  Sinuses: No Frontal/maxillary tenderness  ENT/Mouth: Ext aud canals clear, normal light reflex with TMs without erythema, bulging. Good dentition. No erythema, swelling, or exudate on post pharynx. Tonsils not swollen or erythematous. Hearing normal.  Neck: Supple, thyroid normal. No bruits  Respiratory: Respiratory effort normal, BS equal bilaterally without rales, rhonchi, wheezing or stridor.  Cardio: RRR without murmurs, rubs or gallops. Brisk  peripheral pulses without edema.  Chest: symmetric, with normal excursions and percussion.  Abdomen: Soft, nontender, no guarding, rebound, hernias, masses, or organomegaly.  Lymphatics: Non tender without lymphadenopathy.  Genitourinary: defer next year Musculoskeletal: Full ROM all peripheral extremities,5/5 strength, and normal gait.  Skin:Warm, dry without rashes, lesions, ecchymosis. Neuro: Cranial nerves intact, reflexes equal bilaterally. Normal muscle tone, no cerebellar symptoms. Sensation intact.  Psych: Awake and oriented X 3, normal affect, Insight and Judgment appropriate.   EKG: IRBBB no ST changes AORTA SCAN: defer  Vicie Mutters 2:28 PM [facility]

## 2017-04-09 ENCOUNTER — Encounter: Payer: Self-pay | Admitting: Physician Assistant

## 2017-04-09 ENCOUNTER — Ambulatory Visit: Payer: No Typology Code available for payment source | Admitting: Physician Assistant

## 2017-04-09 VITALS — BP 120/84 | HR 94 | Temp 97.9°F | Resp 14 | Ht 71.5 in | Wt 195.8 lb

## 2017-04-09 DIAGNOSIS — I10 Essential (primary) hypertension: Secondary | ICD-10-CM

## 2017-04-09 DIAGNOSIS — Z79899 Other long term (current) drug therapy: Secondary | ICD-10-CM

## 2017-04-09 DIAGNOSIS — Z6826 Body mass index (BMI) 26.0-26.9, adult: Secondary | ICD-10-CM

## 2017-04-09 DIAGNOSIS — I4510 Unspecified right bundle-branch block: Secondary | ICD-10-CM

## 2017-04-09 DIAGNOSIS — F411 Generalized anxiety disorder: Secondary | ICD-10-CM

## 2017-04-09 DIAGNOSIS — Z136 Encounter for screening for cardiovascular disorders: Secondary | ICD-10-CM

## 2017-04-09 DIAGNOSIS — D239 Other benign neoplasm of skin, unspecified: Secondary | ICD-10-CM

## 2017-04-09 DIAGNOSIS — Z0000 Encounter for general adult medical examination without abnormal findings: Secondary | ICD-10-CM

## 2017-04-09 DIAGNOSIS — E559 Vitamin D deficiency, unspecified: Secondary | ICD-10-CM | POA: Diagnosis not present

## 2017-04-09 DIAGNOSIS — Z1389 Encounter for screening for other disorder: Secondary | ICD-10-CM

## 2017-04-09 DIAGNOSIS — R351 Nocturia: Secondary | ICD-10-CM

## 2017-04-09 DIAGNOSIS — E785 Hyperlipidemia, unspecified: Secondary | ICD-10-CM

## 2017-04-09 DIAGNOSIS — N401 Enlarged prostate with lower urinary tract symptoms: Secondary | ICD-10-CM

## 2017-04-09 MED ORDER — CYCLOBENZAPRINE HCL 5 MG PO TABS: 5.0000 mg | ORAL_TABLET | Freq: Three times a day (TID) | ORAL | 3 refills | Status: DC | PRN

## 2017-04-09 MED ORDER — BUPROPION HCL ER (XL) 150 MG PO TB24: 150.0000 mg | ORAL_TABLET | ORAL | 2 refills | Status: DC

## 2017-04-09 MED ORDER — CITALOPRAM HYDROBROMIDE 40 MG PO TABS: 40.0000 mg | ORAL_TABLET | Freq: Every day | ORAL | 3 refills | Status: DC

## 2017-04-09 NOTE — Patient Instructions (Addendum)
Try flexeril 5 mg at night for sleep/TMJ for 3-5 nights If this does not help you can do wellbutrin  .  11 Tips to Follow:  1. No caffeine after 3pm: Avoid beverages with caffeine (soda, tea, energy drinks, etc.) especially after 3pm. 2. Don't go to bed hungry: Have your evening meal at least 3 hrs. before going to sleep. It's fine to have a small bedtime snack such as a glass of milk and a few crackers but don't have a big meal. 3. Have a nightly routine before bed: Plan on "winding down" before you go to sleep. Begin relaxing about 1 hour before you go to bed. Try doing a quiet activity such as listening to calming music, reading a book or meditating. 4. Turn off the TV and ALL electronics including video games, tablets, laptops, etc. 1 hour before sleep, and keep them out of the bedroom. 5. Turn off your cell phone and all notifications (new email and text alerts) or even better, leave your phone outside your room while you sleep. Studies have shown that a part of your brain continues to respond to certain lights and sounds even while you're still asleep. 6. Make your bedroom quiet, dark and cool. If you can't control the noise, try wearing earplugs or using a fan to block out other sounds. 7. Practice relaxation techniques. Try reading a book or meditating or drain your brain by writing a list of what you need to do the next day. 8. Don't nap unless you feel sick: you'll have a better night's sleep. 9. Don't smoke, or quit if you do. Nicotine, alcohol, and marijuana can all keep you awake. Talk to your health care provider if you need help with substance use. 10. Most importantly, wake up at the same time every day (or within 1 hour of your usual wake up time) EVEN on the weekends. A regular wake up time promotes sleep hygiene and prevents sleep problems. 11. Reduce exposure to bright light in the last three hours of the day before going to sleep. Maintaining good sleep hygiene and having good  sleep habits lower your risk of developing sleep problems. Getting better sleep can also improve your concentration and alertness. Try the simple steps in this guide. If you still have trouble getting enough rest, make an appointment with your health care provider.

## 2017-04-10 LAB — LIPID PANEL
CHOL/HDL RATIO: 2.2 (calc) (ref <5.0)
CHOLESTEROL: 172 mg/dL (ref <200)
HDL: 80 mg/dL (ref >40)
LDL Cholesterol (Calc): 71 mg/dL (calc)
Non-HDL Cholesterol (Calc): 92 mg/dL (calc) (ref <130)
Triglycerides: 119 mg/dL (ref <150)

## 2017-04-10 LAB — HEPATIC FUNCTION PANEL
AG Ratio: 2 (calc) (ref 1.0–2.5)
ALT: 16 U/L (ref 9–46)
AST: 14 U/L (ref 10–40)
Albumin: 4.7 g/dL (ref 3.6–5.1)
Alkaline phosphatase (APISO): 52 U/L (ref 40–115)
BILIRUBIN INDIRECT: 0.4 mg/dL (ref 0.2–1.2)
Bilirubin, Direct: 0.1 mg/dL (ref 0.0–0.2)
GLOBULIN: 2.4 g/dL (ref 1.9–3.7)
TOTAL PROTEIN: 7.1 g/dL (ref 6.1–8.1)
Total Bilirubin: 0.5 mg/dL (ref 0.2–1.2)

## 2017-04-10 LAB — CBC WITH DIFFERENTIAL/PLATELET
BASOS PCT: 0.9 %
Basophils Absolute: 59 cells/uL (ref 0–200)
Eosinophils Absolute: 156 cells/uL (ref 15–500)
Eosinophils Relative: 2.4 %
HCT: 47.4 % (ref 38.5–50.0)
Hemoglobin: 16.4 g/dL (ref 13.2–17.1)
Lymphs Abs: 1580 cells/uL (ref 850–3900)
MCH: 32.9 pg (ref 27.0–33.0)
MCHC: 34.6 g/dL (ref 32.0–36.0)
MCV: 95.2 fL (ref 80.0–100.0)
MONOS PCT: 8.3 %
MPV: 9.8 fL (ref 7.5–12.5)
Neutro Abs: 4167 cells/uL (ref 1500–7800)
Neutrophils Relative %: 64.1 %
PLATELETS: 300 10*3/uL (ref 140–400)
RBC: 4.98 10*6/uL (ref 4.20–5.80)
RDW: 12.9 % (ref 11.0–15.0)
TOTAL LYMPHOCYTE: 24.3 %
WBC mixed population: 540 cells/uL (ref 200–950)
WBC: 6.5 10*3/uL (ref 3.8–10.8)

## 2017-04-10 LAB — BASIC METABOLIC PANEL WITH GFR
BUN / CREAT RATIO: 7 (calc) (ref 6–22)
BUN: 6 mg/dL — AB (ref 7–25)
CALCIUM: 9.7 mg/dL (ref 8.6–10.3)
CHLORIDE: 102 mmol/L (ref 98–110)
CO2: 28 mmol/L (ref 20–32)
Creat: 0.87 mg/dL (ref 0.60–1.35)
GFR, Est African American: 124 mL/min/{1.73_m2} (ref >=60)
GFR, Est Non African American: 107 mL/min/{1.73_m2} (ref >=60)
GLUCOSE: 83 mg/dL (ref 65–99)
Potassium: 4.4 mmol/L (ref 3.5–5.3)
Sodium: 138 mmol/L (ref 135–146)

## 2017-04-10 LAB — VITAMIN D 25 HYDROXY (VIT D DEFICIENCY, FRACTURES): VIT D 25 HYDROXY: 34 ng/mL (ref 30–100)

## 2017-04-10 LAB — TSH: TSH: 1.35 m[IU]/L (ref 0.40–4.50)

## 2017-04-10 LAB — MAGNESIUM: Magnesium: 2.3 mg/dL (ref 1.5–2.5)

## 2018-04-15 ENCOUNTER — Encounter: Payer: Self-pay | Admitting: Physician Assistant

## 2018-04-30 ENCOUNTER — Other Ambulatory Visit: Payer: Self-pay

## 2018-04-30 MED ORDER — CITALOPRAM HYDROBROMIDE 40 MG PO TABS: 40.0000 mg | ORAL_TABLET | Freq: Every day | ORAL | 3 refills | Status: DC

## 2018-06-25 NOTE — Progress Notes (Signed)
Complete Physical  Assessment and Plan: Dysplastic nevus No abnormal moles at this time, continue monitoring, wear sunscreen   Hyperlipidemia  check lipids, decrease fatty foods, increase activity.  - CBC with Differential/Platelet - BASIC METABOLIC PANEL WITH GFR - Hepatic function panel - TSH - Lipid panel   Anxiety state better sleep habits discussed, stress management techniques discussed, increase water, good sleep hygiene discussed, increase exercise, and increase veggies.   Right bundle branch block No symptoms, normal cardio  Retinal detachment, bilateral S/p surgery, better, continue eye appointments   Routine general medical examination at a health care facility 1 year  Medication management - Magnesium  Vitamin D deficiency - VITAMIN D 25 Hydroxy (Vit-D Deficiency, Fractures)   Discussed med's effects and SE's. Screening labs and tests as requested with regular follow-up as recommended. Over 40 minutes of exam, counseling, chart review and critical decision making was performed Future Appointments  Date Time Provider [place]  06/30/2019  2:00 PM Vicie Mutters, PA-C [facility] None    HPI Patient presents for a complete physical.   His blood pressure has been controlled at home, today their BP is BP: 122/80 He does workout, normally runs but has been doing deer hunting. He denies chest pain, shortness of breath, dizziness.  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 Daughter is 7. New job is stressing him out, he is still on 40 min of celexa.  He is working out, eating better. Not sleeping well but no panic attacks.  Still smoking but decreasing significantly, smoking socially and he is vaping daily 6 mg.   He is not on cholesterol medication and denies myalgias. His cholesterol is at goal. The cholesterol last visit was:   Lab Results  Component Value Date   CHOL 172 04/09/2017   HDL 80  04/09/2017   LDLCALC 71 04/09/2017   TRIG 119 04/09/2017   CHOLHDL 2.2 04/09/2017     Last A1C in the office was:  Lab Results  Component Value Date   HGBA1C 5.0 04/05/2016  Patient is on Vitamin D supplement.   Lab Results  Component Value Date   VD25OH 34 04/09/2017    Last PSA was: Lab Results  Component Value Date   PSA 0.4 04/05/2016   BMI is Body mass index is 26.75 kg/m., he is working on diet and exercise. Wt Readings from Last 3 Encounters:  06/26/18 197 lb 3.2 oz (89.4 kg)  04/09/17 195 lb 12.8 oz (88.8 kg)  04/05/16 198 lb (89.8 kg)   Current Medications:  Current Outpatient Medications on File Prior to Visit  Medication Sig Dispense Refill  . citalopram (CELEXA) 40 MG tablet Take 1 tablet (40 mg total) by mouth daily. 90 tablet 3  . Multiple Vitamins-Minerals (MULTIVITAMIN WITH MINERALS) tablet Take 1 tablet by mouth daily.    Marland Kitchen buPROPion (WELLBUTRIN XL) 150 MG 24 hr tablet Take 1 tablet (150 mg total) by mouth every morning. 30 tablet 2   No current facility-administered medications on file prior to visit.    Health Maintenance:  Immunization History  Administered Date(s) Administered  . PPD Test 01/28/2014  . Tdap 03/17/2008   Tetanus: 2010 DUE get next OV Pneumovax: N/A Prevnar 13: N/A Flu vaccine: declines Zostavax: N/A  DEXA:  Colonoscopy: never EGD:  Eye Exam:Glasses, Dr. Nicki Reaper, yearly [place] dentist  Patient Care Team: Unk Pinto, MD as PCP - General (Internal Medicine) Macarthur Critchley, [place] as Referring Physician (Optometry) [place],  Annita Brod, MD as Consulting Physician (Cardiology) Drusilla Kanner, MD as Referring Physician (Dermatology)  Medical History:  Past Medical History:  Diagnosis Date  . Anemia   . Hyperlipidemia   . Retinal detachment 04/04/2009   Qualifier: Diagnosis of  By: Orville Govern, CMA, Carol     Allergies No Known Allergies  SURGICAL HISTORY He  has a past surgical history that includes Skin biopsy (Left)  and Retinal detachment surgery (Right).   FAMILY HISTORY His family history includes Alcohol abuse in his father; Cancer in his maternal grandfather and another family member; Heart disease in his mother; Hyperlipidemia in his father and mother; Hypertension in his father.   SOCIAL HISTORY He  reports that he has been smoking. He has never used smokeless tobacco. He reports current alcohol use. He reports that he does not use drugs.  Review of Systems:  Review of Systems  Constitutional: Negative for chills, diaphoresis, fever, malaise/fatigue and weight loss.  HENT: Negative.   Eyes: Negative.   Respiratory: Negative.  Negative for cough and shortness of breath.   Cardiovascular: Negative.  Negative for chest pain.  Gastrointestinal: Negative.   Genitourinary: Negative.   Musculoskeletal: Negative.   Skin: Negative.   Neurological: Negative.  Negative for weakness.  Endo/Heme/Allergies: Negative.   Psychiatric/Behavioral: Negative for depression, hallucinations, memory loss, substance abuse and suicidal ideas. The patient is nervous/anxious and has insomnia.     Physical Exam: Estimated body mass index is 26.75 kg/m as calculated from the following:   Height as of this encounter: 6' (1.829 m).   Weight as of this encounter: 197 lb 3.2 oz (89.4 kg). BP 122/80   Pulse 86   Temp 97.6 F (36.4 C)   Ht 6' (1.829 m)   Wt 197 lb 3.2 oz (89.4 kg)   SpO2 97%   BMI 26.75 kg/m  General Appearance: Well nourished, in no apparent distress.  Eyes: PERRLA, EOMs, conjunctiva no swelling or erythema, normal fundi and vessels.  Sinuses: No Frontal/maxillary tenderness  ENT/Mouth: Ext aud canals clear, normal light reflex with TMs without erythema, bulging. Good dentition. No erythema, swelling, or exudate on post pharynx. Tonsils not swollen or erythematous. Hearing normal.  Neck: Supple, thyroid normal. No bruits  Respiratory: Respiratory effort normal, BS equal bilaterally without rales,  rhonchi, wheezing or stridor.  Cardio: RRR without murmurs, rubs or gallops. Brisk peripheral pulses without edema.  Chest: symmetric, with normal excursions and percussion.  Abdomen: Soft, nontender, no guarding, rebound, hernias, masses, or organomegaly.  Lymphatics: Non tender without lymphadenopathy.  Genitourinary: defer next year Musculoskeletal: Full ROM all peripheral extremities,5/5 strength, and normal gait.  Skin:Warm, dry without rashes, lesions, ecchymosis. Neuro: Cranial nerves intact, reflexes equal bilaterally. Normal muscle tone, no cerebellar symptoms. Sensation intact.  Psych: Awake and oriented X 3, normal affect, Insight and Judgment appropriate.   EKG: declines due to insurance AORTA SCAN: defer  Vicie Mutters 1:34 PM [facility]

## 2018-06-26 ENCOUNTER — Encounter: Payer: Self-pay | Admitting: Physician Assistant

## 2018-06-26 ENCOUNTER — Ambulatory Visit: Payer: No Typology Code available for payment source | Admitting: Physician Assistant

## 2018-06-26 ENCOUNTER — Other Ambulatory Visit: Payer: Self-pay

## 2018-06-26 VITALS — BP 122/80 | HR 86 | Temp 97.6°F | Ht 72.0 in | Wt 197.2 lb

## 2018-06-26 DIAGNOSIS — Z1389 Encounter for screening for other disorder: Secondary | ICD-10-CM

## 2018-06-26 DIAGNOSIS — D239 Other benign neoplasm of skin, unspecified: Secondary | ICD-10-CM

## 2018-06-26 DIAGNOSIS — E785 Hyperlipidemia, unspecified: Secondary | ICD-10-CM

## 2018-06-26 DIAGNOSIS — E559 Vitamin D deficiency, unspecified: Secondary | ICD-10-CM | POA: Diagnosis not present

## 2018-06-26 DIAGNOSIS — Z0000 Encounter for general adult medical examination without abnormal findings: Secondary | ICD-10-CM | POA: Diagnosis not present

## 2018-06-26 DIAGNOSIS — Z13220 Encounter for screening for lipoid disorders: Secondary | ICD-10-CM | POA: Diagnosis not present

## 2018-06-26 DIAGNOSIS — F411 Generalized anxiety disorder: Secondary | ICD-10-CM

## 2018-06-26 DIAGNOSIS — R351 Nocturia: Secondary | ICD-10-CM

## 2018-06-26 DIAGNOSIS — Z1329 Encounter for screening for other suspected endocrine disorder: Secondary | ICD-10-CM

## 2018-06-26 DIAGNOSIS — Z6826 Body mass index (BMI) 26.0-26.9, adult: Secondary | ICD-10-CM

## 2018-06-26 DIAGNOSIS — Z79899 Other long term (current) drug therapy: Secondary | ICD-10-CM | POA: Diagnosis not present

## 2018-06-26 DIAGNOSIS — N401 Enlarged prostate with lower urinary tract symptoms: Secondary | ICD-10-CM

## 2018-06-26 DIAGNOSIS — I4510 Unspecified right bundle-branch block: Secondary | ICD-10-CM

## 2018-06-26 NOTE — Patient Instructions (Signed)
Monitor water and food before bed No TV or electronics before bed.  Try to increase exercise.   Can cut back on the celexa to 20 mg for a few months and see how you do Then we can call int he 10 mg and you can take 1 a day for a few months Pay attention to how you are doing, and ask other peoples opinions.   SMOKING CESSATION  [employer]  [phone] for more information or for a free program for smoking cessation help.   You can call QUIT SMART 1-800-QUIT-NOW for free nicotine patches or replacement therapy- if they are out- keep calling  [facility] Can call for smoking cessation classes, [phone]  If you have a smart phone, please look up Smoke Free app, this will help you stay on track and give you information about money you have saved, life that you have gained back and a ton of more information.     ADVANTAGES OF QUITTING SMOKING  Within 20 minutes, blood pressure decreases. Your pulse is at normal level.  After 8 hours, carbon monoxide levels in the blood return to normal. Your oxygen level increases.  After 24 hours, the chance of having a heart attack starts to decrease. Your breath, hair, and body stop smelling like smoke.  After 48 hours, damaged nerve endings begin to recover. Your sense of taste and smell improve.  After 72 hours, the body is virtually free of nicotine. Your bronchial tubes relax and breathing becomes easier.  After 2 to 12 weeks, lungs can hold more air. Exercise becomes easier and circulation improves.  After 1 year, the risk of coronary heart disease is cut in half.  After 5 years, the risk of stroke falls to the same as a nonsmoker.  After 10 years, the risk of lung cancer is cut in half and the risk of other cancers decreases significantly.  After 15 years, the risk of coronary heart disease drops, usually to the level of a nonsmoker.  You will have extra money to spend on things other than  cigarettes.

## 2018-06-27 LAB — COMPLETE METABOLIC PANEL WITH GFR
AG Ratio: 1.8 (calc) (ref 1.0–2.5)
ALT: 30 U/L (ref 9–46)
AST: 25 U/L (ref 10–40)
Albumin: 4.8 g/dL (ref 3.6–5.1)
Alkaline phosphatase (APISO): 59 U/L (ref 36–130)
BUN: 9 mg/dL (ref 7–25)
CO2: 25 mmol/L (ref 20–32)
Calcium: 10.2 mg/dL (ref 8.6–10.3)
Chloride: 104 mmol/L (ref 98–110)
Creat: 0.9 mg/dL (ref 0.60–1.35)
GFR, Est African American: 122 mL/min/{1.73_m2} (ref >=60)
GFR, Est Non African American: 105 mL/min/{1.73_m2} (ref >=60)
Globulin: 2.6 g/dL (calc) (ref 1.9–3.7)
Glucose, Bld: 88 mg/dL (ref 65–99)
Potassium: 4.2 mmol/L (ref 3.5–5.3)
Sodium: 139 mmol/L (ref 135–146)
Total Bilirubin: 0.5 mg/dL (ref 0.2–1.2)
Total Protein: 7.4 g/dL (ref 6.1–8.1)

## 2018-06-27 LAB — CBC WITH DIFFERENTIAL/PLATELET
Absolute Monocytes: 511 cells/uL (ref 200–950)
Basophils Absolute: 64 cells/uL (ref 0–200)
Basophils Relative: 0.9 %
Eosinophils Absolute: 128 cells/uL (ref 15–500)
Eosinophils Relative: 1.8 %
HCT: 48.5 % (ref 38.5–50.0)
Hemoglobin: 17 g/dL (ref 13.2–17.1)
Lymphs Abs: 1548 cells/uL (ref 850–3900)
MCH: 34.2 pg — ABNORMAL HIGH (ref 27.0–33.0)
MCHC: 35.1 g/dL (ref 32.0–36.0)
MCV: 97.6 fL (ref 80.0–100.0)
MPV: 10 fL (ref 7.5–12.5)
Monocytes Relative: 7.2 %
Neutro Abs: 4849 cells/uL (ref 1500–7800)
Neutrophils Relative %: 68.3 %
Platelets: 313 10*3/uL (ref 140–400)
RBC: 4.97 10*6/uL (ref 4.20–5.80)
RDW: 13 % (ref 11.0–15.0)
Total Lymphocyte: 21.8 %
WBC: 7.1 10*3/uL (ref 3.8–10.8)

## 2018-06-27 LAB — MICROALBUMIN / CREATININE URINE RATIO
Creatinine, Urine: 170 mg/dL (ref 20–320)
Microalb Creat Ratio: 21 mcg/mg creat (ref <30)
Microalb, Ur: 3.6 mg/dL

## 2018-06-27 LAB — LIPID PANEL
Cholesterol: 169 mg/dL (ref <200)
HDL: 76 mg/dL (ref >=40)
LDL Cholesterol (Calc): 77 mg/dL (calc)
Non-HDL Cholesterol (Calc): 93 mg/dL (calc) (ref <130)
Total CHOL/HDL Ratio: 2.2 (calc) (ref <5.0)
Triglycerides: 81 mg/dL (ref <150)

## 2018-06-27 LAB — URINALYSIS, ROUTINE W REFLEX MICROSCOPIC
Bacteria, UA: NONE SEEN /HPF
Bilirubin Urine: NEGATIVE
Glucose, UA: NEGATIVE
Hgb urine dipstick: NEGATIVE
Hyaline Cast: NONE SEEN /LPF
Nitrite: NEGATIVE
Specific Gravity, Urine: 1.02 (ref 1.001–1.03)
Squamous Epithelial / HPF: NONE SEEN /HPF (ref <=5)
WBC, UA: NONE SEEN /HPF (ref 0–5)
pH: 8 (ref 5.0–8.0)

## 2018-06-27 LAB — VITAMIN D 25 HYDROXY (VIT D DEFICIENCY, FRACTURES): Vit D, 25-Hydroxy: 36 ng/mL (ref 30–100)

## 2018-06-27 LAB — TSH: TSH: 1.26 mIU/L (ref 0.40–4.50)

## 2018-06-27 LAB — MAGNESIUM: Magnesium: 1.9 mg/dL (ref 1.5–2.5)

## 2019-05-13 ENCOUNTER — Other Ambulatory Visit: Payer: Self-pay

## 2019-05-13 MED ORDER — CITALOPRAM HYDROBROMIDE 40 MG PO TABS: 40.0000 mg | ORAL_TABLET | Freq: Every day | ORAL | 0 refills | Status: DC

## 2019-06-29 NOTE — Progress Notes (Signed)
Complete Physical  Assessment and Plan:  Hector Bernard was seen today for annual exam.  Diagnoses and all orders for this visit:  Encounter for annual physical exam Yearly   Hyperlipidemia, unspecified hyperlipidemia type Discussed dietary and exercise modifications Low fat diet -     CBC with Differential/Platelet -     COMPLETE METABOLIC PANEL WITH GFR -     Lipid panel  Vitamin D deficiency No supplementation at this time -     VITAMIN D 25 Hydroxy (Vit-D Deficiency, Fractures)  BPH associated with nocturia Doing well at this time Continue to monitor  Anxiety stateDoing well on current regiment Continue  Discussed stress management techniques   Discussed good sleep hygiene Discussed increasing physical activity and exercise Increase water intake -     citalopram (CELEXA) 40 MG tablet; Take 1 tablet (40 mg total) by mouth daily.  Actinic keratosis In office cryo therapy Tolerated well After care discussed and education provided.  Dysplastic nevus Continue to monitor Discussed sunscreen  BMI 26.0-26.9,adult Discussed dietary and exercise modifications  Need for Tdap vaccination -     Tdap vaccine greater than or equal to 7yo IM Received today  Screening for blood or protein in urine -     Cancel: Urinalysis w microscopic + reflex cultur  Screening for cardiovascular condition -     EKG 12-Lead  Screening for thyroid disorder -     Cancel: TSH  Screening for diabetes mellitus -     Cancel: Hemoglobin A1c  Right bundle branch block -     EKG 12-Lead No changes Asymptomatic  Medication management Continued  Discussed med's effects and SE's. Screening labs and tests as requested with regular follow-up as recommended. Over 40 minutes of face to face interview, exam, counseling, chart review and critical decision making was performed.  Future Appointments  Date Time Provider [place]  07/05/2020  2:00 PM Garnet Sierras, NP [facility] None     HPI Patient presents for a complete physical.  Reports overall he is doing well.  He no concerns about medications.  He reports there is a concern about a spot on his back.  Denies any pain, bleeding,itching, exudate, change of color or shape.  Reports his wife wanted him to mention this.  He has been to the dermatologist in the past but not in the past year.  He is active and walks 3-4 days a week and drinks a gallon or more of water a day.  Reports he eats 2 meals a day but he eats snacks throughout the day as well.  He stopped smoking last year, 08/2018.  His blood pressure has been controlled at home, today their BP is BP: 114/68 He does workout, normally runs but has been doing deer hunting. He denies chest pain, shortness of breath, dizziness.  He is self imployed, Physiological scientist.  HE is married and has a daughter, 70 years old.     He is not on cholesterol medication and denies myalgias. His cholesterol is at goal. The cholesterol last visit was:   Lab Results  Component Value Date   CHOL 169 06/26/2018   HDL 76 06/26/2018   LDLCALC 77 06/26/2018   TRIG 81 06/26/2018   CHOLHDL 2.2 06/26/2018     Last A1C in the office was:  Lab Results  Component Value Date   HGBA1C 5.0 04/05/2016   Patient is on Vitamin D supplement for deficiency. Lab Results  Component Value Date   VD25OH 64 06/30/2019  Last PSA was: Lab Results  Component Value Date   PSA 0.4 04/05/2016   BMI is Body mass index is 26.58 kg/m., he is working on diet and exercise. Wt Readings from Last 3 Encounters:  06/30/19 196 lb (88.9 kg)  06/26/18 197 lb 3.2 oz (89.4 kg)  04/09/17 195 lb 12.8 oz (88.8 kg)   Current Medications:  Current Outpatient Medications on File Prior to Visit  Medication Sig Dispense Refill  . Multiple Vitamins-Minerals (MULTIVITAMIN WITH MINERALS) tablet Take 1 tablet by mouth daily.    . Zinc 50 MG TABS Take by mouth.     No current facility-administered medications on  file prior to visit.   Health Maintenance:  Immunization History  Administered Date(s) Administered  . PPD Test 01/28/2014  . Tdap 03/17/2008, 06/30/2019   Tetanus: 2010, received today Pneumovax: N/A Prevnar 13: N/A Flu vaccine: declines Zostavax: N/A  DEXA: N/A Colonoscopy: never EGD: N/A Eye Exam:Glasses, Dr. Nicki Reaper, yearly Dentist: Q58month   Patient Care Team: MUnk Pinto MD as PCP - General (Internal Medicine) SMacarthur Critchley OLinnas Referring Physician (Optometry) MBurnell Blanks MD as Consulting Physician (Cardiology) TDrusilla Kanner MD as Referring Physician (Dermatology)  Medical History:  Past Medical History:  Diagnosis Date  . Anemia   . Hyperlipidemia   . Retinal detachment 04/04/2009   Qualifier: Diagnosis of  By: FOrville Govern CMA, Carol     Allergies No Known Allergies  SURGICAL HISTORY He  has a past surgical history that includes Skin biopsy (Left) and Retinal detachment surgery (Right).   FAMILY HISTORY His family history includes Alcohol abuse in his father; Cancer in his maternal grandfather and another family member; Heart disease in his mother; Hyperlipidemia in his father and mother; Hypertension in his father.   SOCIAL HISTORY He  reports that he has been smoking. He has never used smokeless tobacco. He reports current alcohol use. He reports that he does not use drugs.  Review of Systems:  Review of Systems  Constitutional: Negative for chills, diaphoresis, fever, malaise/fatigue and weight loss.  HENT: Negative for congestion, ear discharge, ear pain, hearing loss, nosebleeds, sinus pain, sore throat and tinnitus.   Eyes: Negative for blurred vision, double vision, photophobia, pain, discharge and redness.  Respiratory: Negative for cough, hemoptysis, sputum production, shortness of breath, wheezing and stridor.   Cardiovascular: Negative for chest pain, palpitations, orthopnea, claudication, leg swelling and PND.  Gastrointestinal:  Negative for abdominal pain, blood in stool, constipation, diarrhea, heartburn, melena, nausea and vomiting.  Genitourinary: Negative for dysuria, flank pain, frequency, hematuria and urgency.  Musculoskeletal: Negative for back pain, falls, joint pain, myalgias and neck pain.  Skin: Negative for itching and rash.       Spot to center of upper back and left hand.  Neurological: Negative for dizziness, tingling, tremors, sensory change, speech change, focal weakness, seizures, loss of consciousness, weakness and headaches.  Endo/Heme/Allergies: Negative for environmental allergies and polydipsia. Does not bruise/bleed easily.  Psychiatric/Behavioral: Negative for depression, hallucinations, memory loss, substance abuse and suicidal ideas. The patient is not nervous/anxious and does not have insomnia.     Physical Exam: Estimated body mass index is 26.58 kg/m as calculated from the following:   Height as of 06/26/18: 6' (1.829 m).   Weight as of this encounter: 196 lb (88.9 kg). BP 114/68   Pulse 70   Temp 98.1 F (36.7 C)   Wt 196 lb (88.9 kg)   SpO2 98%   BMI 26.58 kg/m   General Appearance:  Well nourished, in no apparent distress.  Eyes: PERRLA, EOMs, conjunctiva no swelling or erythema, normal fundi and vessels.  Sinuses: No Frontal/maxillary tenderness  ENT/Mouth: Ext aud canals clear, normal light reflex with TMs without erythema, bulging. Good dentition. No erythema, swelling, or exudate on post pharynx. Tonsils not swollen or erythematous. Hearing normal.  Neck: Supple, thyroid normal. No bruits  Respiratory: Respiratory effort normal, BS equal bilaterally without rales, rhonchi, wheezing or stridor.  Cardio: RRR without murmurs, rubs or gallops. Brisk peripheral pulses without edema.  Chest: symmetric, with normal excursions and percussion.  Abdomen: Soft, nontender, no guarding, rebound, hernias, masses, or organomegaly.  Lymphatics: Non tender without lymphadenopathy.   Genitourinary: defer next year Musculoskeletal: Full ROM all peripheral extremities,5/5 strength, and normal gait.   Scaly white skin, center middle approx C7-8, irregular borders. Skin:Warm, dry without rashes, ecchymosis. Neuro: Cranial nerves intact, reflexes equal bilaterally. Normal muscle tone, no cerebellar symptoms. Sensation intact.  Psych: Awake and oriented X 3, normal affect, Insight and Judgment appropriate.   EKG: RBBB, no change from 2019  AORTA SCAN: defer  Elonna Mcfarlane 2:55 PM [facility]

## 2019-06-30 ENCOUNTER — Other Ambulatory Visit: Payer: Self-pay

## 2019-06-30 ENCOUNTER — Encounter: Payer: No Typology Code available for payment source | Admitting: Physician Assistant

## 2019-06-30 ENCOUNTER — Ambulatory Visit: Payer: No Typology Code available for payment source | Admitting: Adult Health Nurse Practitioner

## 2019-06-30 ENCOUNTER — Encounter: Payer: Self-pay | Admitting: Adult Health Nurse Practitioner

## 2019-06-30 VITALS — BP 114/68 | HR 70 | Temp 98.1°F | Wt 196.0 lb

## 2019-06-30 DIAGNOSIS — Z130 Encounter for screening for diseases of the blood and blood-forming organs and certain disorders involving the immune mechanism: Secondary | ICD-10-CM

## 2019-06-30 DIAGNOSIS — Z131 Encounter for screening for diabetes mellitus: Secondary | ICD-10-CM | POA: Diagnosis not present

## 2019-06-30 DIAGNOSIS — Z0001 Encounter for general adult medical examination with abnormal findings: Secondary | ICD-10-CM | POA: Diagnosis not present

## 2019-06-30 DIAGNOSIS — R6889 Other general symptoms and signs: Secondary | ICD-10-CM | POA: Diagnosis not present

## 2019-06-30 DIAGNOSIS — D239 Other benign neoplasm of skin, unspecified: Secondary | ICD-10-CM | POA: Diagnosis not present

## 2019-06-30 DIAGNOSIS — I10 Essential (primary) hypertension: Secondary | ICD-10-CM

## 2019-06-30 DIAGNOSIS — Z79899 Other long term (current) drug therapy: Secondary | ICD-10-CM

## 2019-06-30 DIAGNOSIS — Z13220 Encounter for screening for lipoid disorders: Secondary | ICD-10-CM | POA: Diagnosis not present

## 2019-06-30 DIAGNOSIS — E559 Vitamin D deficiency, unspecified: Secondary | ICD-10-CM | POA: Diagnosis not present

## 2019-06-30 DIAGNOSIS — Z23 Encounter for immunization: Secondary | ICD-10-CM | POA: Diagnosis not present

## 2019-06-30 DIAGNOSIS — Z1329 Encounter for screening for other suspected endocrine disorder: Secondary | ICD-10-CM

## 2019-06-30 DIAGNOSIS — Z0000 Encounter for general adult medical examination without abnormal findings: Secondary | ICD-10-CM

## 2019-06-30 DIAGNOSIS — L570 Actinic keratosis: Secondary | ICD-10-CM

## 2019-06-30 DIAGNOSIS — Z136 Encounter for screening for cardiovascular disorders: Secondary | ICD-10-CM

## 2019-06-30 DIAGNOSIS — Z1389 Encounter for screening for other disorder: Secondary | ICD-10-CM

## 2019-06-30 DIAGNOSIS — I4510 Unspecified right bundle-branch block: Secondary | ICD-10-CM

## 2019-06-30 DIAGNOSIS — N401 Enlarged prostate with lower urinary tract symptoms: Secondary | ICD-10-CM

## 2019-06-30 DIAGNOSIS — Z6826 Body mass index (BMI) 26.0-26.9, adult: Secondary | ICD-10-CM

## 2019-06-30 DIAGNOSIS — F411 Generalized anxiety disorder: Secondary | ICD-10-CM

## 2019-06-30 DIAGNOSIS — E785 Hyperlipidemia, unspecified: Secondary | ICD-10-CM

## 2019-06-30 MED ORDER — CITALOPRAM HYDROBROMIDE 40 MG PO TABS: 40.0000 mg | ORAL_TABLET | Freq: Every day | ORAL | 3 refills | Status: DC

## 2019-06-30 NOTE — Patient Instructions (Signed)
We will contact you via MyChart with your lab results.    Care after Cryotherapy with Liquid Nitrogen    - Patient educated that the area will begin to heal in approximately a week.   -Warning signs of infection include: Increasing redness to surrounding tissue.   Increasing pain not relieved by over the counter tylenol or ibuprofen.  Area is hot to touch   -Recommended that the patient use acetaminophen (Tylenol) 1,053m every 8 hours as needed for pain OR and  ibuprofen (Motrin, Advil) 6037mevery 6 hours or 80061mvery 8 hours as needed for pain.  You may alternate tylenol and ibuprofen as they are different.  Call or return with new or worsening symptoms as discussed in appointment.  May contact office via phone [phone] [facility]  Due to recent changes in healthcare laws, you may see the results of your imaging and laboratory studies on MyChart before your provider has had a chance to review them.  We understand that in some cases there may be results that are confusing or concerning to you. Not all laboratory results come back in the same time frame and the provider may be waiting for multiple results in order to interpret others.  Please give us [place] hours in order for your provider to thoroughly review all the results before contacting the office for clarification of your results.   ++++++++++++++++++++++++++++++++++++++  Vit D  & Vit C 1,000 mg   are recommended to help protect  against the Covid-19 and other Corona viruses.    Also it's recommended  to take  Zinc 50 mg  to help  protect against the Covid-19   and best place to get  is also on [employer]  and don't pay more than 6-8 cents /pill !  =============================== Coronavirus (COVID-19) Are you at risk?  Are you at risk for the Coronavirus (COVID-19)?  To be considered HIGH RISK for Coronavirus (COVID-19), you have to meet the following criteria:  . Traveled to [place] have fever, cough, and shortness of breath within the last 2 weeks of travel OR . Been in close contact with a person diagnosed with COVID-19 within the last 2 weeks and have  . fever, cough,and shortness of breath .  . IF YOU DO NOT MEET THESE CRITERIA, YOU ARE CONSIDERED LOW RISK FOR COVID-19.  What to do if you are HIGH RISK for COVID-19?  . IMarland Kitchen you are having a medical emergency, call 911. . Seek medical care right away. Before you go to a doctor's office, urgent care or emergency department, .  call ahead and tell them about your recent travel, contact with someone diagnosed with COVID-19  .  and your symptoms.  . You should receive instructions from your physician's office regarding next steps of care.  . When you arrive at healthcare provider, tell the healthcare staff immediately you have returned from  . visiting [place] in [place] the last two weeks or you have been in close contact with a person diagnosed with  . COVID-19 in the last 2 weeks.   . Tell the health care staff about your symptoms: fever, cough and shortness of breath. . After you have been seen by a medical  provider, you will be either: o Tested for (COVID-19) and discharged home on quarantine except to seek medical care if  o symptoms worsen, and asked to  - Stay home and avoid contact with others until you get your results (4-5 days)  - Avoid travel on public transportation if possible (such as bus, train, or airplane) or o Sent to the Emergency Department by EMS for evaluation, COVID-19 testing  and  o possible admission depending on your condition and test results.  What to do if you are LOW RISK for COVID-19?  Reduce your risk of any infection by using the same precautions used for avoiding the common cold  or flu:  Marland Kitchen Wash your hands often with soap and warm water for at least 20 seconds.  If soap and water are not readily available,  . use an alcohol-based hand sanitizer with at least 60% alcohol.  . If coughing or sneezing, cover your mouth and nose by coughing or sneezing into the elbow areas of your shirt or coat, .  into a tissue or into your sleeve (not your hands). . Avoid shaking hands with others and consider head nods or verbal greetings only. . Avoid touching your eyes, nose, or mouth with unwashed hands.  . Avoid close contact with people who are sick. . Avoid places or events with large numbers of people in one location, like concerts or sporting events. . Carefully consider travel plans you have or are making. . If you are planning any travel outside or inside the [place], visit the CDC's Travelers' Health webpage for the latest health notices. . If you have some symptoms but not all symptoms, continue to monitor at home and seek medical attention  . if your symptoms worsen. . If you are having a medical emergency, call 911. >>>>>>>>>>>>>>>>>>>>>>>>>>>> Preventive Care for Adults  A healthy lifestyle and preventive care can promote health and wellness. Preventive health guidelines for men include the following key practices:  A routine yearly physical is a good way to check with your health care provider about your health and preventative screening. It is a chance to share any concerns and updates on your health and to receive a thorough exam.  Visit your dentist for a routine exam and preventative care every 6 months. Brush your teeth twice a day and floss once a day. Good oral hygiene prevents tooth decay and gum disease.  The frequency of eye exams is based on your age, health, family medical history, use of contact lenses, and other factors. Follow your health care provider's recommendations for frequency of eye exams.  Eat a healthy diet. Foods such as vegetables, fruits, whole  grains, low-fat dairy products, and lean protein foods contain the nutrients you need without too many calories. Decrease your intake of foods high in solid fats, added sugars, and salt. Eat the right amount of calories for you. Get information about a proper diet from your health care provider, if necessary.  Regular physical exercise is one of the most important things you can do for your health. Most adults should get at least 150 minutes of moderate-intensity exercise (any activity that increases your heart rate and causes you to sweat) each week. In addition, most adults need muscle-strengthening exercises on 2 or more days a week.  Maintain a healthy weight. The body mass index (BMI) is a screening tool to identify possible weight problems. It provides an estimate of body fat based on height and weight. Your health care provider can  find your BMI and can help you achieve or maintain a healthy weight. For adults 20 years and older:  A BMI below 18.5 is considered underweight.  A BMI of 18.5 to 24.9 is normal.  A BMI of 25 to 29.9 is considered overweight.  A BMI of 30 and above is considered obese.  Maintain normal blood lipids and cholesterol levels by exercising and minimizing your intake of saturated fat. Eat a balanced diet with plenty of fruit and vegetables. Blood tests for lipids and cholesterol should begin at age 24 and be repeated every 5 years. If your lipid or cholesterol levels are high, you are over 50, or you are at high risk for heart disease, you may need your cholesterol levels checked more frequently. Ongoing high lipid and cholesterol levels should be treated with medicines if diet and exercise are not working.  If you smoke, find out from your health care provider how to quit. If you do not use tobacco, do not start.  Lung cancer screening is recommended for adults aged 37-80 years who are at high risk for developing lung cancer because of a history of smoking. A yearly  low-dose CT scan of the lungs is recommended for people who have at least a 30-pack-year history of smoking and are a current smoker or have quit within the past 15 years. A pack year of smoking is smoking an average of 1 pack of cigarettes a day for 1 year (for example: 1 pack a day for 30 years or 2 packs a day for 15 years). Yearly screening should continue until the smoker has stopped smoking for at least 15 years. Yearly screening should be stopped for people who develop a health problem that would prevent them from having lung cancer treatment.  If you choose to drink alcohol, do not have more than 2 drinks per day. One drink is considered to be 12 ounces (355 mL) of beer, 5 ounces (148 mL) of wine, or 1.5 ounces (44 mL) of liquor.  Avoid use of street drugs. Do not share needles with anyone. Ask for help if you need support or instructions about stopping the use of drugs.  High blood pressure causes heart disease and increases the risk of stroke. Your blood pressure should be checked at least every 1-2 years. Ongoing high blood pressure should be treated with medicines, if weight loss and exercise are not effective.  If you are 53-59 years old, ask your health care provider if you should take aspirin to prevent heart disease.  Diabetes screening involves taking a blood sample to check your fasting blood sugar level. This should be done once every 3 years, after age 81, if you are within normal weight and without risk factors for diabetes. Testing should be considered at a younger age or be carried out more frequently if you are overweight and have at least 1 risk factor for diabetes.  Colorectal cancer can be detected and often prevented. Most routine colorectal cancer screening begins at the age of 70 and continues through age 33. However, your health care provider may recommend screening at an earlier age if you have risk factors for colon cancer. On a yearly basis, your health care provider may  provide home test kits to check for hidden blood in the stool. Use of a small camera at the end of a tube to directly examine the colon (sigmoidoscopy or colonoscopy) can detect the earliest forms of colorectal cancer. Talk to your health care provider about  this at age 54, when routine screening begins. Direct exam of the colon should be repeated every 5-10 years through age 79, unless early forms of precancerous polyps or small growths are found.   Talk with your health care provider about prostate cancer screening.  Testicular cancer screening isrecommended for adult males. Screening includes self-exam, a health care provider exam, and other screening tests. Consult with your health care provider about any symptoms you have or any concerns you have about testicular cancer.  Use sunscreen. Apply sunscreen liberally and repeatedly throughout the day. You should seek shade when your shadow is shorter than you. Protect yourself by wearing long sleeves, pants, a wide-brimmed hat, and sunglasses year round, whenever you are outdoors.  Once a month, do a whole-body skin exam, using a mirror to look at the skin on your back. Tell your health care provider about new moles, moles that have irregular borders, moles that are larger than a pencil eraser, or moles that have changed in shape or color.  Stay current with required vaccines (immunizations).  Influenza vaccine. All adults should be immunized every year.  Tetanus, diphtheria, and acellular pertussis (Td, Tdap) vaccine. An adult who has not previously received Tdap or who does not know his vaccine status should receive 1 dose of Tdap. This initial dose should be followed by tetanus and diphtheria toxoids (Td) booster doses every 10 years. Adults with an unknown or incomplete history of completing a 3-dose immunization series with Td-containing vaccines should begin or complete a primary immunization series including a Tdap dose. Adults should receive a  Td booster every 10 years.  Varicella vaccine. An adult without evidence of immunity to varicella should receive 2 doses or a second dose if he has previously received 1 dose.  Human papillomavirus (HPV) vaccine. Males aged 55-21 years who have not received the vaccine previously should receive the 3-dose series. Males aged 22-26 years may be immunized. Immunization is recommended through the age of 11 years for any male who has sex with males and did not get any or all doses earlier. Immunization is recommended for any person with an immunocompromised condition through the age of 14 years if he did not get any or all doses earlier. During the 3-dose series, the second dose should be obtained 4-8 weeks after the first dose. The third dose should be obtained 24 weeks after the first dose and 16 weeks after the second dose.  Zoster vaccine. One dose is recommended for adults aged 47 years or older unless certain conditions are present.    PREVNAR  - Pneumococcal 13-valent conjugate (PCV13) vaccine. When indicated, a person who is uncertain of his immunization history and has no record of immunization should receive the PCV13 vaccine. An adult aged 65 years or older who has certain medical conditions and has not been previously immunized should receive 1 dose of PCV13 vaccine. This PCV13 should be followed with a dose of pneumococcal polysaccharide (PPSV23) vaccine. The PPSV23 vaccine dose should be obtained at least 1 r more year(s) after the dose of PCV13 vaccine. An adult aged 13 years or older who has certain medical conditions and previously received 1 or more doses of PPSV23 vaccine should receive 1 dose of PCV13. The PCV13 vaccine dose should be obtained 1 or more years after the last PPSV23 vaccine dose.    PNEUMOVAX - Pneumococcal polysaccharide (PPSV23) vaccine. When PCV13 is also indicated, PCV13 should be obtained first. All adults aged 60 years and older should be  immunized. An adult younger  than age 69 years who has certain medical conditions should be immunized. Any person who resides in a nursing home or long-term care facility should be immunized. An adult smoker should be immunized. People with an immunocompromised condition and certain other conditions should receive both PCV13 and PPSV23 vaccines. People with human immunodeficiency virus (HIV) infection should be immunized as soon as possible after diagnosis. Immunization during chemotherapy or radiation therapy should be avoided. Routine use of PPSV23 vaccine is not recommended for American Indians, [place]atives, or people younger than 65 years unless there are medical conditions that require PPSV23 vaccine. When indicated, people who have unknown immunization and have no record of immunization should receive PPSV23 vaccine. One-time revaccination 5 years after the first dose of PPSV23 is recommended for people aged 19-64 years who have chronic kidney failure, nephrotic syndrome, asplenia, or immunocompromised conditions. People who received 1-2 doses of PPSV23 before age 54 years should receive another dose of PPSV23 vaccine at age 22 years or later if at least 5 years have passed since the previous dose. Doses of PPSV23 are not needed for people immunized with PPSV23 at or after age 28 years.    Hepatitis A vaccine. Adults who wish to be protected from this disease, have certain high-risk conditions, work with hepatitis A-infected animals, work in hepatitis A research labs, or travel to or work in countries with a high rate of hepatitis A should be immunized. Adults who were previously unvaccinated and who anticipate close contact with an international adoptee during the first 60 days after arrival in the [place] from a country with a high rate of hepatitis A should be immunized.    Hepatitis B vaccine. Adults should be immunized if they wish to be protected from this disease, have certain high-risk conditions, may be exposed  to blood or other infectious body fluids, are household contacts or sex partners of hepatitis B positive people, are clients or workers in certain care facilities, or travel to or work in countries with a high rate of hepatitis B.   Preventive Service / Frequency   Ages 24 to 38  Blood pressure check.  Lipid and cholesterol check  Lung cancer screening. / Every year if you are aged 86-80 years and have a 30-pack-year history of smoking and currently smoke or have quit within the past 15 years. Yearly screening is stopped once you have quit smoking for at least 15 years or develop a health problem that would prevent you from having lung cancer treatment.  Fecal occult blood test (FOBT) of stool. / Every year beginning at age 22 and continuing until age 36. You may not have to do this test if you get a colonoscopy every 10 years.  Flexible sigmoidoscopy** or colonoscopy.** / Every 5 years for a flexible sigmoidoscopy or every 10 years for a colonoscopy beginning at age 36 and continuing until age 56. Screening for abdominal aortic aneurysm (AAA)  by ultrasound is recommended for people who have history of high blood pressure or who are current or former smokers. +++++++++++ Recommend Adult Low Dose Aspirin or  coated  Aspirin 81 mg daily  To reduce risk of Colon Cancer 40 %,  Skin Cancer 26 % ,  Malignant Melanoma 46%  and  Pancreatic cancer 60% ++++++++++++++++++++ Vitamin D goal  is between 70-100.  Please make sure that you are taking your Vitamin D as directed.  It is very important as a natural anti-inflammatory  helping hair,  skin, and nails, as well as reducing stroke and heart attack risk.  It helps your bones and helps with mood. It also decreases numerous cancer risks so please take it as directed.  Low Vit D is associated with a 200-300% higher risk for CANCER  and 200-300% higher risk for HEART   ATTACK  &  STROKE.   .....................................Marland Kitchen It is also  associated with higher death rate at younger ages,  autoimmune diseases like Rheumatoid arthritis, Lupus, Multiple Sclerosis.    Also many other serious conditions, like depression, Alzheimer's Dementia, infertility, muscle aches, fatigue, fibromyalgia - just to name a few. +++++++++++++++++++++ Recommend the book "The END of DIETING" by Dr Excell Seltzer  & the book "The END of DIABETES " by Dr Excell Seltzer At [email or web address] - get book & Audio CD's    Being diabetic has a  300% increased risk for heart attack, stroke, cancer, and alzheimer- type vascular dementia. It is very important that you work harder with diet by avoiding all foods that are white. Avoid white rice (brown & wild rice is OK), white potatoes (sweetpotatoes in moderation is OK), White bread or wheat bread or anything made out of white flour like bagels, donuts, rolls, buns, biscuits, cakes, pastries, cookies, pizza crust, and pasta (made from white flour & egg whites) - vegetarian pasta or spinach or wheat pasta is OK. Multigrain breads like Arnold's or [employer], or multigrain sandwich thins or flatbreads.  Diet, exercise and weight loss can reverse and cure diabetes in the early stages.  Diet, exercise and weight loss is very important in the control and prevention of complications of diabetes which affects every system in your body, ie. Brain - dementia/stroke, eyes - glaucoma/blindness, heart - heart attack/heart failure, kidneys - dialysis, stomach - gastric paralysis, intestines - malabsorption, nerves - severe painful neuritis, circulation - gangrene & loss of a leg(s), and finally cancer and Alzheimers.    I recommend avoid fried & greasy foods,  sweets/candy, white rice (brown or wild rice or Quinoa is OK), white potatoes (sweet potatoes are OK) - anything made from white flour - bagels, doughnuts, rolls, buns, biscuits,white and wheat breads, pizza crust and traditional pasta made of white flour & egg white(vegetarian pasta or  spinach or wheat pasta is OK).  Multi-grain bread is OK - like multi-grain flat bread or sandwich thins. Avoid alcohol in excess. Exercise is also important.    Eat all the vegetables you want - avoid meat, especially red meat and dairy - especially cheese.  Cheese is the most concentrated form of trans-fats which is the worst thing to clog up our arteries. Veggie cheese is OK which can be found in the fresh produce section at [employer] or [employer] or [employer]  ++++++++++++++++++++++ DASH Eating Plan  DASH stands for "Dietary Approaches to Stop Hypertension."   The DASH eating plan is a healthy eating plan that has been shown to reduce high blood pressure (hypertension). Additional health benefits may include reducing the risk of type 2 diabetes mellitus, heart disease, and stroke. The DASH eating plan may also help with weight loss. WHAT DO I NEED TO KNOW ABOUT THE DASH EATING PLAN? For the DASH eating plan, you will follow these general guidelines:  Choose foods with a percent daily value for sodium of less than 5% (as listed on the food label).  Use salt-free seasonings or herbs instead of table salt or sea salt.  Check with your health care provider or pharmacist before  using salt substitutes.  Eat lower-sodium products, often labeled as "lower sodium" or "no salt added."  Eat fresh foods.  Eat more vegetables, fruits, and low-fat dairy products.  Choose whole grains. Look for the word "whole" as the first word in the ingredient list.  Choose fish   Limit sweets, desserts, sugars, and sugary drinks.  Choose heart-healthy fats.  Eat veggie cheese   Eat more home-cooked food and less restaurant, buffet, and fast food.  Limit fried foods.  Cook foods using methods other than frying.  Limit canned vegetables. If you do use them, rinse them well to decrease the sodium.  When eating at a restaurant, ask that your food be prepared with less salt, or no salt if  possible.                      WHAT FOODS CAN I EAT? Read Dr Fara Olden Fuhrman's books on The End of Dieting & The End of Diabetes  Grains Whole grain or whole wheat bread. Brown rice. Whole grain or whole wheat pasta. Quinoa, bulgur, and whole grain cereals. Low-sodium cereals. Corn or whole wheat flour tortillas. Whole grain cornbread. Whole grain crackers. Low-sodium crackers.  Vegetables Fresh or frozen vegetables (raw, steamed, roasted, or grilled). Low-sodium or reduced-sodium tomato and vegetable juices. Low-sodium or reduced-sodium tomato sauce and paste. Low-sodium or reduced-sodium canned vegetables.   Fruits All fresh, canned (in natural juice), or frozen fruits.  Protein Products  All fish and seafood.  Dried beans, peas, or lentils. Unsalted nuts and seeds. Unsalted canned beans.  Dairy Low-fat dairy products, such as skim or 1% milk, 2% or reduced-fat cheeses, low-fat ricotta or cottage cheese, or plain low-fat yogurt. Low-sodium or reduced-sodium cheeses.  Fats and Oils Tub margarines without trans fats. Light or reduced-fat mayonnaise and salad dressings (reduced sodium). Avocado. Safflower, olive, or canola oils. Natural peanut or almond butter.  Other Unsalted popcorn and pretzels. The items listed above may not be a complete list of recommended foods or beverages. Contact your dietitian for more options.  +++++++++++++++++++  WHAT FOODS ARE NOT RECOMMENDED? Grains/ White flour or wheat flour White bread. White pasta. White rice. Refined cornbread. Bagels and croissants. Crackers that contain trans fat.  Vegetables  Creamed or fried vegetables. Vegetables in a . Regular canned vegetables. Regular canned tomato sauce and paste. Regular tomato and vegetable juices.  Fruits Dried fruits. Canned fruit in light or heavy syrup. Fruit juice.  Meat and Other Protein Products Meat in general - RED meat & White meat.  Fatty cuts of meat. Ribs, chicken wings, all processed  meats as bacon, sausage, bologna, salami, fatback, hot dogs, bratwurst and packaged luncheon meats.  Dairy Whole or 2% milk, cream, half-and-half, and cream cheese. Whole-fat or sweetened yogurt. Full-fat cheeses or blue cheese. Non-dairy creamers and whipped toppings. Processed cheese, cheese spreads, or cheese curds.  Condiments Onion and garlic salt, seasoned salt, table salt, and sea salt. Canned and packaged gravies. Worcestershire sauce. Tartar sauce. Barbecue sauce. Teriyaki sauce. Soy sauce, including reduced sodium. Steak sauce. Fish sauce. Oyster sauce. Cocktail sauce. Horseradish. Ketchup and mustard. Meat flavorings and tenderizers. Bouillon cubes. Hot sauce. Tabasco sauce. Marinades. Taco seasonings. Relishes.  Fats and Oils Butter, stick margarine, lard, shortening and bacon fat. Coconut, palm kernel, or palm oils. Regular salad dressings.  Pickles and olives. Salted popcorn and pretzels.  The items listed above may not be a complete list of foods and beverages to avoid.

## 2019-07-01 LAB — CBC WITH DIFFERENTIAL/PLATELET
Absolute Monocytes: 546 cells/uL (ref 200–950)
Basophils Absolute: 42 cells/uL (ref 0–200)
Basophils Relative: 0.7 %
Eosinophils Absolute: 192 cells/uL (ref 15–500)
Eosinophils Relative: 3.2 %
HCT: 48.2 % (ref 38.5–50.0)
Hemoglobin: 16.6 g/dL (ref 13.2–17.1)
Lymphs Abs: 1554 cells/uL (ref 850–3900)
MCH: 34.1 pg — ABNORMAL HIGH (ref 27.0–33.0)
MCHC: 34.4 g/dL (ref 32.0–36.0)
MCV: 99 fL (ref 80.0–100.0)
MPV: 10 fL (ref 7.5–12.5)
Monocytes Relative: 9.1 %
Neutro Abs: 3666 cells/uL (ref 1500–7800)
Neutrophils Relative %: 61.1 %
Platelets: 277 10*3/uL (ref 140–400)
RBC: 4.87 10*6/uL (ref 4.20–5.80)
RDW: 12.7 % (ref 11.0–15.0)
Total Lymphocyte: 25.9 %
WBC: 6 10*3/uL (ref 3.8–10.8)

## 2019-07-01 LAB — COMPLETE METABOLIC PANEL WITH GFR
AG Ratio: 2.1 (calc) (ref 1.0–2.5)
ALT: 30 U/L (ref 9–46)
AST: 21 U/L (ref 10–40)
Albumin: 4.9 g/dL (ref 3.6–5.1)
Alkaline phosphatase (APISO): 62 U/L (ref 36–130)
BUN: 11 mg/dL (ref 7–25)
CO2: 25 mmol/L (ref 20–32)
Calcium: 10.1 mg/dL (ref 8.6–10.3)
Chloride: 102 mmol/L (ref 98–110)
Creat: 0.8 mg/dL (ref 0.60–1.35)
GFR, Est African American: 127 mL/min/{1.73_m2} (ref >=60)
GFR, Est Non African American: 109 mL/min/{1.73_m2} (ref >=60)
Globulin: 2.3 g/dL (calc) (ref 1.9–3.7)
Glucose, Bld: 88 mg/dL (ref 65–99)
Potassium: 4.5 mmol/L (ref 3.5–5.3)
Sodium: 137 mmol/L (ref 135–146)
Total Bilirubin: 0.8 mg/dL (ref 0.2–1.2)
Total Protein: 7.2 g/dL (ref 6.1–8.1)

## 2019-07-01 LAB — LIPID PANEL
Cholesterol: 188 mg/dL (ref <200)
HDL: 77 mg/dL (ref >=40)
LDL Cholesterol (Calc): 92 mg/dL (calc)
Non-HDL Cholesterol (Calc): 111 mg/dL (calc) (ref <130)
Total CHOL/HDL Ratio: 2.4 (calc) (ref <5.0)
Triglycerides: 98 mg/dL (ref <150)

## 2019-07-01 LAB — VITAMIN D 25 HYDROXY (VIT D DEFICIENCY, FRACTURES): Vit D, 25-Hydroxy: 64 ng/mL (ref 30–100)

## 2019-12-25 ENCOUNTER — Other Ambulatory Visit: Payer: Self-pay | Admitting: Internal Medicine

## 2020-07-05 ENCOUNTER — Encounter: Payer: No Typology Code available for payment source | Admitting: Adult Health Nurse Practitioner

## 2020-07-08 ENCOUNTER — Other Ambulatory Visit: Payer: Self-pay

## 2020-07-08 DIAGNOSIS — F411 Generalized anxiety disorder: Secondary | ICD-10-CM

## 2020-07-08 MED ORDER — CITALOPRAM HYDROBROMIDE 40 MG PO TABS: 40.0000 mg | ORAL_TABLET | Freq: Every day | ORAL | 0 refills | Status: DC

## 2020-08-01 NOTE — Progress Notes (Signed)
Complete Physical  Assessment and Plan:  Hector Bernard was seen today for annual exam.  Diagnoses and all orders for this visit:  Encounter for annual physical exam Due annually  Hyperlipidemia, unspecified hyperlipidemia type Discussed dietary and exercise modifications Low fat diet -     CBC with Differential/Platelet -     COMPLETE METABOLIC PANEL WITH GFR -     Lipid panel  Vitamin D deficiency Taking supplement regularly -     VITAMIN D 25 Hydroxy (Vit-D Deficiency, Fractures)  Anxiety state Well managed by current regimen; continue medications Stress management techniques discussed, increase water, good sleep hygiene discussed, increase exercise, and increase veggies.  -     citalopram (CELEXA) 40 MG tablet; Take 1 tablet (40 mg total) by mouth daily.  Dysplastic nevus Hx of, no concerning areas; monitor regularly and notify any changes Protect skin with sunscreen  BMI 28  Discussed dietary and exercise modifications  Screening for blood or protein in urine -     Urinalysis w microscopic   Screening for cardiovascular condition -     EKG 12-Lead - defer  Screening for thyroid disorder -     Cancel: TSH - defer  Screening for diabetes mellitus -     Cancel: Hemoglobin A1c   Right bundle branch block Was stable/unchanged last check Asymptomatic, defer check this year due to high deductible insurance - defer EKG  Medication management Continued  B12 def Check levels, on supplement - B12  Current vaper Discussed risks associated with tobacco use and advised to reduce or quit Patient is ready to do so and plans to quit cold [place], has tapered to low dose Declines Chantix or other medication Resources given Will follow up at the next visit   Discussed med's effects and SE's. Screening labs and tests as requested with regular follow-up as recommended. Over 40 minutes of face to face interview, exam, counseling, chart review and critical decision making was  performed.  Future Appointments  Date Time Provider Hector Bernard  08/02/2021  2:00 PM Hector Comber, NP [facility] None    HPI 45 y.o. male patient presents for a complete physical. He has Anxiety state; RIGHT BUNDLE BRANCH BLOCK; Hyperlipidemia; Dysplastic nevus; and Current every day nicotine vapor product user on their problem list.  Married, 1 daughter, 59 y/o, home schooled. 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   Reports overall he is doing well, denies concerns.   He stopped smoking last year, 09/2018, 2 year smoking history. Currently vaping but tapering off gradually, lowest nicotine dose.   Hx of anxiety well controlled on celexa 40 mg, doing well for many years.   BMI is Body mass index is 28.42 kg/m., he has been working on diet and exercise. He is active and walks 3-4 days a week, riding bike, and drinks a gallon or more of water a day. Denies soda, 1-2 burbon per day, needs to cut back. Reports he eats 2 meals a day but he eats snacks throughout the day as well. Mostly fresh foods, 1+ serving of meat with each meal, grills, 3 veggies at least per day. Wife uses whole grains and grinds, generally lots of healthy  Wt Readings from Last 3 Encounters:  08/02/20 203 lb 12.8 oz (92.4 kg)  06/30/19 196 lb (88.9 kg)  06/26/18 197 lb 3.2 oz (89.4 kg)   Today their BP is BP: 106/64  He does workout. He denies chest pain, shortness of breath, dizziness.   He is not  on cholesterol medication and denies myalgias. His cholesterol is at goal. The cholesterol last visit was:   Lab Results  Component Value Date   CHOL 188 06/30/2019   HDL 77 06/30/2019   LDLCALC 92 06/30/2019   TRIG 98 06/30/2019   CHOLHDL 2.4 06/30/2019    He has been working on diet and exercise for glucose management, and denies increased appetite, nausea, paresthesia of the feet, polydipsia, polyuria and visual disturbances. Last A1C in the office was:  Lab Results  Component Value Date    HGBA1C 5.0 04/05/2016   Patient is on Vitamin D supplement, taking 4000 IU daily.   Lab Results  Component Value Date   VD25OH 64 06/30/2019     He denies LUTs. Last PSA:  Lab Results  Component Value Date   PSA 0.4 04/05/2016   PSA 0.62 01/28/2014   He is on B12 tab supplement, unknown dose.  Lab Results  Component Value Date   QASTMHDQ22 297 02/24/2015      Current Medications:  Current Outpatient Medications on File Prior to Visit  Medication Sig Dispense Refill  . Cyanocobalamin (VITAMIN B12 PO) Take by mouth daily.    . Multiple Vitamins-Minerals (MULTIVITAMIN WITH MINERALS) tablet Take 1 tablet by mouth daily.    . vitamin C (ASCORBIC ACID) 500 MG tablet Take 500 mg by mouth daily.    Marland Kitchen VITAMIN D, CHOLECALCIFEROL, PO Take 4,000 Units by mouth.    . Zinc 50 MG TABS Take by mouth.     No current facility-administered medications on file prior to visit.   Health Maintenance:  Immunization History  Administered Date(s) Administered  . PPD Test 01/28/2014  . Tdap 03/17/2008, 06/30/2019   Tetanus: 06/2019 Pneumovax: N/A Flu vaccine: declines Shingrix: N/A Covid 19: declines  Colonoscopy: never, due age 39  EGD: N/A  Eye Exam:Glasses, Dr. Katy [facility] ([facility]), yearly, glasses Dentist: Q61month  Derm: Dr. TTeena Bernard,remote, goes only PRN   Patient Care Team: Hector Pinto MD as PCP - General (Internal Medicine) Hector Critchley OBlancoas Referring Physician (Optometry) Hector Blanks MD as Consulting Physician (Cardiology) Hector Kanner MD as Referring Physician (Dermatology)  Medical History:  Past Medical History:  Diagnosis Date  . Anemia   . Hyperlipidemia   . RBBB   . Retinal detachment 04/04/2009   Qualifier: Diagnosis of  By: Hector Bernard Hector Bernard     Allergies No Known Allergies  SURGICAL HISTORY He  has a past surgical history that includes Skin biopsy (Left) and Retinal detachment surgery (Right).   FAMILY HISTORY His family history  includes Alcohol abuse in his father; Dementia in his father; Heart disease in his mother; Hyperlipidemia in his father and mother; Hypertension in his father; Lung cancer in his maternal grandfather; Prostate cancer in his maternal grandfather.   SOCIAL HISTORY He  reports that he quit smoking about 22 months ago. His smoking use included cigarettes. He started smoking about 6 years ago. He has a 2.00 pack-year smoking history. He has never used smokeless tobacco. He reports current alcohol use of about 7.0 - 14.0 standard drinks of alcohol per week. He reports that he does not use drugs.  Review of Systems:  Review of Systems  Constitutional: Negative for malaise/fatigue and weight loss.  HENT: Negative for hearing loss and tinnitus.   Eyes: Negative for blurred vision and double vision.  Respiratory: Negative for cough, shortness of breath and wheezing.   Cardiovascular: Negative for chest pain, palpitations, orthopnea, claudication and leg  swelling.  Gastrointestinal: Negative for abdominal pain, blood in stool, constipation, diarrhea, heartburn, melena, nausea and vomiting.  Genitourinary: Negative.   Musculoskeletal: Negative for joint pain and myalgias.  Skin: Negative for rash.  Neurological: Negative for dizziness, tingling, sensory change, weakness and headaches.  Endo/Heme/Allergies: Negative for polydipsia.  Psychiatric/Behavioral: Negative.   All other systems reviewed and are negative.   Physical Exam: Estimated body mass index is 28.42 kg/m as calculated from the following:   Height as of this encounter: 5' 11"  (1.803 m).   Weight as of this encounter: 203 lb 12.8 oz (92.4 kg). BP 106/64   Pulse 78   Temp (!) 97.5 F (36.4 C)   Ht 5' 11"  (1.803 m)   Wt 203 lb 12.8 oz (92.4 kg)   SpO2 96%   BMI 28.42 kg/m   General Appearance: Well nourished, in no apparent distress.  Eyes: PERRLA, EOMs, conjunctiva no swelling or erythema, normal fundi and vessels.  Sinuses: No  Frontal/maxillary tenderness  ENT/Mouth: Ext aud canals clear, normal light reflex with TMs without erythema, bulging. Good dentition. No erythema, swelling, or exudate on post pharynx. Tonsils not swollen or erythematous. Hearing normal.  Neck: Supple, thyroid normal. No bruits  Respiratory: Respiratory effort normal, BS equal bilaterally without rales, rhonchi, wheezing or stridor.  Cardio: RRR without murmurs, rubs or gallops. Brisk peripheral pulses without edema.  Chest: symmetric, with normal excursions and percussion.  Abdomen: Soft, nontender, no guarding, rebound, hernias, masses, or organomegaly.  Lymphatics: Non tender without lymphadenopathy.  Genitourinary: defer, doing regular self checks, denies concerns Musculoskeletal: Full ROM all peripheral extremities,5/5 strength, and normal gait.   Scaly white skin, center middle approx C7-8, irregular borders, several similar to back and x 2 to left calf Skin:Warm, dry without rashes, ecchymosis. Neuro: Cranial nerves intact, reflexes equal bilaterally. Normal muscle tone, no cerebellar symptoms. Sensation intact.  Psych: Awake and oriented X 3, normal affect, Insight and Judgment appropriate.   EKG: RBBB, no change from 2019, defer  Izora Ribas, NP 2:05 PM [facility]

## 2020-08-02 ENCOUNTER — Other Ambulatory Visit: Payer: Self-pay

## 2020-08-02 ENCOUNTER — Encounter: Payer: Self-pay | Admitting: Adult Health

## 2020-08-02 ENCOUNTER — Ambulatory Visit (INDEPENDENT_AMBULATORY_CARE_PROVIDER_SITE_OTHER): Payer: No Typology Code available for payment source | Admitting: Adult Health

## 2020-08-02 VITALS — BP 106/64 | HR 78 | Temp 97.5°F | Ht 71.0 in | Wt 203.8 lb

## 2020-08-02 DIAGNOSIS — Z13220 Encounter for screening for lipoid disorders: Secondary | ICD-10-CM | POA: Diagnosis not present

## 2020-08-02 DIAGNOSIS — E559 Vitamin D deficiency, unspecified: Secondary | ICD-10-CM

## 2020-08-02 DIAGNOSIS — Z720 Tobacco use: Secondary | ICD-10-CM

## 2020-08-02 DIAGNOSIS — D239 Other benign neoplasm of skin, unspecified: Secondary | ICD-10-CM

## 2020-08-02 DIAGNOSIS — F411 Generalized anxiety disorder: Secondary | ICD-10-CM

## 2020-08-02 DIAGNOSIS — Z79899 Other long term (current) drug therapy: Secondary | ICD-10-CM

## 2020-08-02 DIAGNOSIS — Z0000 Encounter for general adult medical examination without abnormal findings: Secondary | ICD-10-CM | POA: Diagnosis not present

## 2020-08-02 DIAGNOSIS — Z6826 Body mass index (BMI) 26.0-26.9, adult: Secondary | ICD-10-CM

## 2020-08-02 DIAGNOSIS — E785 Hyperlipidemia, unspecified: Secondary | ICD-10-CM

## 2020-08-02 DIAGNOSIS — Z130 Encounter for screening for diseases of the blood and blood-forming organs and certain disorders involving the immune mechanism: Secondary | ICD-10-CM

## 2020-08-02 DIAGNOSIS — Z1389 Encounter for screening for other disorder: Secondary | ICD-10-CM

## 2020-08-02 DIAGNOSIS — E538 Deficiency of other specified B group vitamins: Secondary | ICD-10-CM

## 2020-08-02 DIAGNOSIS — I4510 Unspecified right bundle-branch block: Secondary | ICD-10-CM

## 2020-08-02 MED ORDER — CITALOPRAM HYDROBROMIDE 40 MG PO TABS: 40.0000 mg | ORAL_TABLET | Freq: Every day | ORAL | 3 refills | Status: DC

## 2020-08-02 NOTE — Patient Instructions (Addendum)
Hector Bernard , Thank you for taking time to come for your Annual Wellness Visit. I appreciate your ongoing commitment to your health goals. Please review the following plan we discussed and let me know if I can assist you in the future.   These are the goals we discussed: Goals    . Quit Smoking       This is a list of the screening recommended for you and due dates:  Health Maintenance  Topic Date Due  . COVID-19 Vaccine (1) 08/18/2020*  . Flu Shot  10/10/2020  . Tetanus Vaccine  06/29/2029  . HPV Vaccine  Aged Out  . Hepatitis C Screening: USPSTF Recommendation to screen - Ages 36-79 yo.  Discontinued  . HIV Screening  Discontinued  *Topic was postponed. The date shown is not the original due date.     Know what a healthy weight is for you (roughly BMI <25) and aim to maintain this  Aim for 7+ servings of fruits and vegetables daily  65-80+ fluid ounces of water or unsweet tea for healthy kidneys  Limit to max 1-2 drink of alcohol per day;  Avoid daily drinking, less is better  Avoid smoking/tobacco  Limit animal fats in diet for cholesterol and heart health - choose grass fed whenever available  Avoid highly processed foods, and foods high in saturated/trans fats  Aim for low stress - take time to unwind and care for your mental health  Aim for 150 min of moderate intensity exercise weekly for heart health, and weights twice weekly for bone health      High-Fiber Eating Plan Fiber, also called dietary fiber, is a type of carbohydrate. It is found foods such as fruits, vegetables, whole grains, and beans. A high-fiber diet can have many health benefits. Your health care provider may recommend a high-fiber diet to help:  Prevent constipation. Fiber can make your bowel movements more regular.  Lower your cholesterol.  Relieve the following conditions: ? Inflammation of veins in the anus (hemorrhoids). ? Inflammation of specific areas of the digestive tract  (uncomplicated diverticulosis). ? A problem of the large intestine, also called the colon, that sometimes causes pain and diarrhea (irritable bowel syndrome, or IBS).  Prevent overeating as part of a weight-loss plan.  Prevent heart disease, type 2 diabetes, and certain cancers. What are tips for following this plan? Reading food labels  Check the nutrition facts label on food products for the amount of dietary fiber. Choose foods that have 5 grams of fiber or more per serving.  The goals for recommended daily fiber intake include: ? Men (age 26 or younger): 34-38 g. ? Men (over age 40): 28-34 g. ? Women (age 3 or younger): 25-28 g. ? Women (over age 60): 22-25 g. Your daily fiber goal is _____________ g.   Shopping  Choose whole fruits and vegetables instead of processed forms, such as apple juice or applesauce.  Choose a wide variety of high-fiber foods such as avocados, lentils, oats, and kidney beans.  Read the nutrition facts label of the foods you choose. Be aware of foods with added fiber. These foods often have high sugar and sodium amounts per serving. Cooking  Use whole-grain flour for baking and cooking.  Cook with brown rice instead of white rice. Meal planning  Start the day with a breakfast that is high in fiber, such as a cereal that contains 5 g of fiber or more per serving.  Eat breads and cereals that are made  with whole-grain flour instead of refined flour or white flour.  Eat brown rice, bulgur wheat, or millet instead of white rice.  Use beans in place of meat in soups, salads, and pasta dishes.  Be sure that half of the grains you eat each day are whole grains. General information  You can get the recommended daily intake of dietary fiber by: ? Eating a variety of fruits, vegetables, grains, nuts, and beans. ? Taking a fiber supplement if you are not able to take in enough fiber in your diet. It is better to get fiber through food than from a  supplement.  Gradually increase how much fiber you consume. If you increase your intake of dietary fiber too quickly, you may have bloating, cramping, or gas.  Drink plenty of water to help you digest fiber.  Choose high-fiber snacks, such as berries, raw vegetables, nuts, and popcorn. What foods should I eat? Fruits Berries. Pears. Apples. Oranges. Avocado. Prunes and raisins. Dried figs. Vegetables Sweet potatoes. Spinach. Kale. Artichokes. Cabbage. Broccoli. Cauliflower. Green peas. Carrots. Squash. Grains Whole-grain breads. Multigrain cereal. Oats and oatmeal. Brown rice. Barley. Bulgur wheat. [place]. Quinoa. Bran muffins. Popcorn. Rye wafer crackers. Meats and other proteins Navy beans, kidney beans, and pinto beans. Soybeans. Split peas. Lentils. Nuts and seeds. Dairy Fiber-fortified yogurt. Beverages Fiber-fortified soy milk. Fiber-fortified orange juice. Other foods Fiber bars. The items listed above may not be a complete list of recommended foods and beverages. Contact a dietitian for more information. What foods should I avoid? Fruits Fruit juice. Cooked, strained fruit. Vegetables Fried potatoes. Canned vegetables. Well-cooked vegetables. Grains White bread. Pasta made with refined flour. White rice. Meats and other proteins Fatty cuts of meat. Fried chicken or fried fish. Dairy Milk. Yogurt. Cream cheese. Sour cream. Fats and oils Butters. Beverages Soft drinks. Other foods Cakes and pastries. The items listed above may not be a complete list of foods and beverages to avoid. Talk with your dietitian about what choices are best for you. Summary  Fiber is a type of carbohydrate. It is found in foods such as fruits, vegetables, whole grains, and beans.  A high-fiber diet has many benefits. It can help to prevent constipation, lower blood cholesterol, aid weight loss, and reduce your risk of heart disease, diabetes, and certain cancers.  Increase your intake of  fiber gradually. Increasing fiber too quickly may cause cramping, bloating, and gas. Drink plenty of water while you increase the amount of fiber you consume.  The best sources of fiber include whole fruits and vegetables, whole grains, nuts, seeds, and beans. This information is not intended to replace advice given to you by your health care provider. Make sure you discuss any questions you have with your health care provider. Document Revised: 07/02/2019 Document Reviewed: 07/02/2019 Elsevier Patient Education  2021 [place].   Steps to Quit Smoking Smoking tobacco is the leading cause of preventable death. It can affect almost every organ in the body. Smoking puts you and those around you at risk for developing many serious chronic diseases. Quitting smoking can be difficult, but it is one of the best things that you can do for your health. It is never too late to quit. How do I get ready to quit? When you decide to quit smoking, create a plan to help you succeed. Before you quit:  Pick a date to quit. Set a date within the next 2 weeks to give you time to prepare.  Write down the reasons why you  are quitting. Keep this list in places where you will see it often.  Tell your family, friends, and co-workers that you are quitting. Support from your loved ones can make quitting easier.  Talk with your health care provider about your options for quitting smoking.  Find out what treatment options are covered by your health insurance.  Identify people, places, things, and activities that make you want to smoke (triggers). Avoid them. What first steps can I take to quit smoking?  Throw away all cigarettes at home, at work, and in your car.  Throw away smoking accessories, such as Scientist, research (medical).  Clean your car. Make sure to empty the ashtray.  Clean your home, including curtains and carpets. What strategies can I use to quit smoking? Talk with your health care provider about  combining strategies, such as taking medicines while you are also receiving in-person counseling. Using these two strategies together makes you more likely to succeed in quitting than if you used either strategy on its own.  If you are pregnant or breastfeeding, talk with your health care provider about finding counseling or other support strategies to quit smoking. Do not take medicine to help you quit smoking unless your health care provider tells you to do so. To quit smoking: Quit right away  Quit smoking completely, instead of gradually reducing how much you smoke over a period of time. Research shows that stopping smoking right away is more successful than gradually quitting.  Attend in-person counseling to help you build problem-solving skills. You are more likely to succeed in quitting if you attend counseling sessions regularly. Even short sessions of 10 minutes can be effective. Take medicine You may take medicines to help you quit smoking. Some medicines require a prescription and some you can purchase over-the-counter. Medicines may have nicotine in them to replace the nicotine in cigarettes. Medicines may:  Help to stop cravings.  Help to relieve withdrawal symptoms. Your health care provider may recommend:  Nicotine patches, gum, or lozenges.  Nicotine inhalers or sprays.  Non-nicotine medicine that is taken by mouth. Find resources Find resources and support systems that can help you to quit smoking and remain smoke-free after you quit. These resources are most helpful when you use them often. They include:  Online chats with a Social worker.  Telephone quitlines.  Printed Furniture conservator/restorer.  Support groups or group counseling.  Text messaging programs.  Mobile phone apps or applications. Use apps that can help you stick to your quit plan by providing reminders, tips, and encouragement. There are many free apps for mobile devices as well as websites. Examples include  Quit Guide from the [employer] and smokefree.gov   What things can I do to make it easier to quit?  Reach out to your family and friends for support and encouragement. Call telephone quitlines (1-800-QUIT-NOW), reach out to support groups, or work with a counselor for support.  Ask people who smoke to avoid smoking around you.  Avoid places that trigger you to smoke, such as bars, parties, or smoke-break areas at work.  Spend time with people who do not smoke.  Lessen the stress in your life. Stress can be a smoking trigger for some people. To lessen stress, try: ? Exercising regularly. ? Doing deep-breathing exercises. ? Doing yoga. ? Meditating. ? Performing a body scan. This involves closing your eyes, scanning your body from head to toe, and noticing which parts of your body are particularly tense. Try to relax the muscles in  those areas.   How will I feel when I quit smoking? Day 1 to 3 weeks Within the first 24 hours of quitting smoking, you may start to feel withdrawal symptoms. These symptoms are usually most noticeable 2-3 days after quitting, but they usually do not last for more than 2-3 weeks. You may experience these symptoms:  Mood swings.  Restlessness, anxiety, or irritability.  Trouble concentrating.  Dizziness.  Strong cravings for sugary foods and nicotine.  Mild weight gain.  Constipation.  Nausea.  Coughing or a sore throat.  Changes in how the medicines that you take for unrelated issues work in your body.  Depression.  Trouble sleeping (insomnia). Week 3 and afterward After the first 2-3 weeks of quitting, you may start to notice more positive results, such as:  Improved sense of smell and taste.  Decreased coughing and sore throat.  Slower heart rate.  Lower blood pressure.  Clearer skin.  The ability to breathe more easily.  Fewer sick days. Quitting smoking can be very challenging. Do not get discouraged if you are not successful the  first time. Some people need to make many attempts to quit before they achieve long-term success. Do your best to stick to your quit plan, and talk with your health care provider if you have any questions or concerns. Summary  Smoking tobacco is the leading cause of preventable death. Quitting smoking is one of the best things that you can do for your health.  When you decide to quit smoking, create a plan to help you succeed.  Quit smoking right away, not slowly over a period of time.  When you start quitting, seek help from your health care provider, family, or friends. This information is not intended to replace advice given to you by your health care provider. Make sure you discuss any questions you have with your health care provider. Document Revised: 11/21/2018 Document Reviewed: 05/17/2018 Elsevier Patient Education  [place].

## 2020-08-03 LAB — COMPLETE METABOLIC PANEL WITH GFR
AG Ratio: 2.5 (calc) (ref 1.0–2.5)
ALT: 27 U/L (ref 9–46)
AST: 21 U/L (ref 10–40)
Albumin: 4.8 g/dL (ref 3.6–5.1)
Alkaline phosphatase (APISO): 67 U/L (ref 36–130)
BUN: 10 mg/dL (ref 7–25)
CO2: 29 mmol/L (ref 20–32)
Calcium: 10.2 mg/dL (ref 8.6–10.3)
Chloride: 105 mmol/L (ref 98–110)
Creat: 0.92 mg/dL (ref 0.60–1.35)
GFR, Est African American: 117 mL/min/{1.73_m2} (ref >=60)
GFR, Est Non African American: 101 mL/min/{1.73_m2} (ref >=60)
Globulin: 1.9 g/dL (calc) (ref 1.9–3.7)
Glucose, Bld: 107 mg/dL — ABNORMAL HIGH (ref 65–99)
Potassium: 4.3 mmol/L (ref 3.5–5.3)
Sodium: 141 mmol/L (ref 135–146)
Total Bilirubin: 0.5 mg/dL (ref 0.2–1.2)
Total Protein: 6.7 g/dL (ref 6.1–8.1)

## 2020-08-03 LAB — CBC WITH DIFFERENTIAL/PLATELET
Absolute Monocytes: 570 cells/uL (ref 200–950)
Basophils Absolute: 53 cells/uL (ref 0–200)
Basophils Relative: 0.7 %
Eosinophils Absolute: 289 cells/uL (ref 15–500)
Eosinophils Relative: 3.8 %
HCT: 46.9 % (ref 38.5–50.0)
Hemoglobin: 15.9 g/dL (ref 13.2–17.1)
Lymphs Abs: 1634 cells/uL (ref 850–3900)
MCH: 33.8 pg — ABNORMAL HIGH (ref 27.0–33.0)
MCHC: 33.9 g/dL (ref 32.0–36.0)
MCV: 99.8 fL (ref 80.0–100.0)
MPV: 9.9 fL (ref 7.5–12.5)
Monocytes Relative: 7.5 %
Neutro Abs: 5054 cells/uL (ref 1500–7800)
Neutrophils Relative %: 66.5 %
Platelets: 296 10*3/uL (ref 140–400)
RBC: 4.7 10*6/uL (ref 4.20–5.80)
RDW: 13.3 % (ref 11.0–15.0)
Total Lymphocyte: 21.5 %
WBC: 7.6 10*3/uL (ref 3.8–10.8)

## 2020-08-03 LAB — URINALYSIS, ROUTINE W REFLEX MICROSCOPIC
Bilirubin Urine: NEGATIVE
Glucose, UA: NEGATIVE
Hgb urine dipstick: NEGATIVE
Leukocytes,Ua: NEGATIVE
Nitrite: NEGATIVE
Protein, ur: NEGATIVE
Specific Gravity, Urine: 1.02 (ref 1.001–1.035)
pH: 6.5 (ref 5.0–8.0)

## 2020-08-03 LAB — LIPID PANEL
Cholesterol: 187 mg/dL (ref <200)
HDL: 69 mg/dL (ref >=40)
LDL Cholesterol (Calc): 97 mg/dL (calc)
Non-HDL Cholesterol (Calc): 118 mg/dL (calc) (ref <130)
Total CHOL/HDL Ratio: 2.7 (calc) (ref <5.0)
Triglycerides: 118 mg/dL (ref <150)

## 2020-08-03 LAB — VITAMIN B12: Vitamin B-12: 941 pg/mL (ref 200–1100)

## 2020-08-03 LAB — VITAMIN D 25 HYDROXY (VIT D DEFICIENCY, FRACTURES): Vit D, 25-Hydroxy: 74 ng/mL (ref 30–100)

## 2021-08-02 ENCOUNTER — Ambulatory Visit (INDEPENDENT_AMBULATORY_CARE_PROVIDER_SITE_OTHER): Payer: No Typology Code available for payment source | Admitting: Adult Health

## 2021-08-02 ENCOUNTER — Encounter: Payer: Self-pay | Admitting: Adult Health

## 2021-08-02 VITALS — BP 112/80 | HR 82 | Temp 97.5°F | Ht 70.5 in | Wt 198.4 lb

## 2021-08-02 DIAGNOSIS — E559 Vitamin D deficiency, unspecified: Secondary | ICD-10-CM

## 2021-08-02 DIAGNOSIS — Z1211 Encounter for screening for malignant neoplasm of colon: Secondary | ICD-10-CM

## 2021-08-02 DIAGNOSIS — R350 Frequency of micturition: Secondary | ICD-10-CM

## 2021-08-02 DIAGNOSIS — Z13220 Encounter for screening for lipoid disorders: Secondary | ICD-10-CM | POA: Diagnosis not present

## 2021-08-02 DIAGNOSIS — Z1389 Encounter for screening for other disorder: Secondary | ICD-10-CM | POA: Diagnosis not present

## 2021-08-02 DIAGNOSIS — Z125 Encounter for screening for malignant neoplasm of prostate: Secondary | ICD-10-CM | POA: Diagnosis not present

## 2021-08-02 DIAGNOSIS — N401 Enlarged prostate with lower urinary tract symptoms: Secondary | ICD-10-CM

## 2021-08-02 DIAGNOSIS — E785 Hyperlipidemia, unspecified: Secondary | ICD-10-CM

## 2021-08-02 DIAGNOSIS — Z86018 Personal history of other benign neoplasm: Secondary | ICD-10-CM

## 2021-08-02 DIAGNOSIS — Z720 Tobacco use: Secondary | ICD-10-CM

## 2021-08-02 DIAGNOSIS — E663 Overweight: Secondary | ICD-10-CM

## 2021-08-02 DIAGNOSIS — I4510 Unspecified right bundle-branch block: Secondary | ICD-10-CM

## 2021-08-02 DIAGNOSIS — Z0001 Encounter for general adult medical examination with abnormal findings: Secondary | ICD-10-CM

## 2021-08-02 DIAGNOSIS — Z0000 Encounter for general adult medical examination without abnormal findings: Secondary | ICD-10-CM | POA: Diagnosis not present

## 2021-08-02 DIAGNOSIS — Z79899 Other long term (current) drug therapy: Secondary | ICD-10-CM

## 2021-08-02 DIAGNOSIS — F411 Generalized anxiety disorder: Secondary | ICD-10-CM

## 2021-08-02 DIAGNOSIS — Z8042 Family history of malignant neoplasm of prostate: Secondary | ICD-10-CM

## 2021-08-02 DIAGNOSIS — E538 Deficiency of other specified B group vitamins: Secondary | ICD-10-CM

## 2021-08-02 NOTE — Patient Instructions (Addendum)
[name] , Thank you for taking time to come for your Annual Wellness Visit. I appreciate your ongoing commitment to your health goals. Please review the following plan we discussed and let me know if I can assist you in the future.   These are the goals we discussed:  Goals       LDL CALC < 100      Reduce saturated fat/cholesterol in diet Unsaturated fats are good to have - nuts, seeds, avocado, olive oil, salmon, etc Increase soluble fiber -       Quit Smoking      Weight (lb) < 190 lb (86.2 kg) (pt-stated)        This is a list of the screening recommended for you and due dates:  Health Maintenance  Topic Date Due   COVID-19 Vaccine (1) Never done   Colon Cancer Screening  Never done   Flu Shot  10/10/2021   Tetanus Vaccine  06/29/2029   HPV Vaccine  Aged Out   Hepatitis C Screening: USPSTF Recommendation to screen - Ages 18-79 yo.  Discontinued   HIV Screening  Discontinued     SMOKING CESSATION  American cancer society  [phone] for more information or for a free program for smoking cessation help.   You can call QUIT SMART 1-800-QUIT-NOW for free nicotine patches or replacement therapy- if they are out- keep calling  [facility] Can call for smoking cessation classes, [phone]  If you have a smart phone, please look up Smoke Free app, this will help you stay on track and give you information about money you have saved, life that you have gained back and a ton of more information.     ADVANTAGES OF QUITTING SMOKING Within 20 minutes, blood pressure decreases. Your pulse is at normal level. After 8 hours, carbon monoxide levels in the blood return to normal. Your oxygen level increases. After 24 hours, the chance of having a heart attack starts to decrease. Your breath, hair, and body stop smelling like smoke. After 48 hours, damaged nerve endings begin to recover. Your sense of taste and smell improve. After 72 hours, the body is  virtually free of nicotine. Your bronchial tubes relax and breathing becomes easier. After 2 to 12 weeks, lungs can hold more air. Exercise becomes easier and circulation improves. After 1 year, the risk of coronary heart disease is cut in half. After 5 years, the risk of stroke falls to the same as a nonsmoker. After 10 years, the risk of lung cancer is cut in half and the risk of other cancers decreases significantly. After 15 years, the risk of coronary heart disease drops, usually to the level of a nonsmoker. You will have extra money to spend on things other than cigarettes.    COLOGUARD INFORMATION  Colon cancer is 3rd most diagnosed cancer and 2nd leading cause of death in both men and women 13 years of age and older despite being one of the most preventable and treatable cancers if found early.  4 of out 5 people diagnosed with colon cancer have NO prior family history.  When caught EARLY 90% of colon cancer is curable.   More than 92% of cologuard patients have NO out of pocket cost for screening however only your insurer can confirm how Cologuard would be covered for you. Cologuard has a team of specialist that can help you contact your insurer and ask the right questions. Please call [phone] so they can help.  You will receive a short call from [place] support center at [employer], when you receive a call they will say they are from [place],  to confirm your mailing address and give you more information.  When they calll you, it will appear on the caller ID as "[employer]" or in some cases only this number will appear, [phone].   [employer] will ship your collection kit directly to you. You will collect a single stool sample in the privacy of your own home, no special preparation required. You will return the kit via [place] pre-paid shipping or pick-up, in the same box it arrived in. Then I will contact you to discuss  your results after I receive them from the laboratory.   If you have any questions or concerns, [employer] Customer Support Specialist are available 24 hours a day, 7 days a week at [phone] or go to [email or web address].

## 2021-08-02 NOTE — Progress Notes (Signed)
Complete Physical  Assessment and Plan:  Hector Bernard was seen today for annual exam.  Diagnoses and all orders for this visit:  Encounter for Annual Physical Exam with abnormal findings Due annually  Health Maintenance reviewed Healthy lifestyle reviewed and goals set  Hyperlipidemia, unspecified hyperlipidemia type Discussed dietary and exercise modifications Low fat diet -     COMPLETE METABOLIC PANEL WITH GFR -     Lipid panel  Vitamin D deficiency Taking supplement regularly, at goal 60-100 Defer check -   Anxiety state In remission off of medication Stress management techniques discussed, increase water, good sleep hygiene discussed, increase exercise, and increase veggies.  Follow up 1 month, call the office if any new AE's from medications and we will switch them  History of dysplastic nevus Hx of, no concerning areas; monitor regularly and notify any changes Protect skin with sunscreen  Overweight - BMI 28  Discussed dietary and exercise modifications  Screening for blood or protein in urine -     Urinalysis w microscopic   Screening for cardiovascular condition -     EKG 12-Lead - defer  Screening for thyroid disorder -     Cancel: TSH - defer  Screening for diabetes mellitus -     Cancel: Hemoglobin A1c - defer, checking fasting CMP  Right bundle branch block Was stable/unchanged last check Asymptomatic,  - EKG  Medication management Continued  B12 def Continue supplement, defer check as last at goal, high deductible insurance  Current vaper Discussed risks associated with tobacco use and advised to reduce or quit Patient is ready to do so and plans to quit cold [place], has tapered to low dose Declines Chantix or other medication  Resources given Will follow up at the next visit  Colon cancer screening  - patient declines a colonoscopy even though the risks and benefits were discussed at length. Colon cancer is 3rd most diagnosed cancer and 2nd  leading cause of death in both men and women 98 years of age and older. Patient understands the risk of cancer and death with declining the test however they are willing to do cologuard screening instead. They understand that this is not as sensitive or specific as a colonoscopy and they are still recommended to get a colonoscopy. The cologuard will be sent out to their house.    Orders Placed This Encounter  Procedures   CBC with Differential/Platelet   COMPLETE METABOLIC PANEL WITH GFR   Lipid panel   Urinalysis, Routine w reflex microscopic   PSA   Cologuard   EKG 12-Lead     Discussed med's effects and SE's. Screening labs and tests as requested with regular follow-up as recommended. Over 40 minutes of face to face interview, exam, counseling, chart review and critical decision making was performed.  Future Appointments  Date Time Provider [place]  08/03/2022 10:00 AM Demetra Shiner, Townsend Roger, NP [facility] None    HPI 46 y.o. male patient presents for a complete physical. He has Anxiety state; RIGHT BUNDLE BRANCH BLOCK; Hyperlipidemia; History of dysplastic nevus; Current every day nicotine vapor product user; Vitamin D deficiency; B12 deficiency; and Overweight (BMI 25.0-29.9) on their problem list.  Married, 1 daughter, 56 y/o, home schooled.  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  Reports overall he is doing well, denies concerns.   He stopped smoking cigarettes in 09/2018, 2 year smoking history.  Currently vaping but tapering off gradually, lowest nicotine dose.   Hx of anxiety well controlled on celexa  40 mg, doing well for many years, was able to taper off last year.   BMI is Body mass index is 28.07 kg/m., he has been working on diet and exercise. He is active and walks 3-4 days a week, riding bike, and drinks a gallon or more of water a day. Denies soda, 1-2 burbon per day, has reduced.  Would like to get down to <190 lb Plans to make small  changes Mostly fresh foods, 1+ serving of meat with each meal, grills, 3 veggies at least per day. Wife uses whole grains and grinds, generally lots of healthy  Wt Readings from Last 3 Encounters:  08/02/21 198 lb 6.4 oz (90 kg)  08/02/20 203 lb 12.8 oz (92.4 kg)  06/30/19 196 lb (88.9 kg)   Today their BP is BP: 112/80  He does workout. He denies chest pain, shortness of breath, dizziness.   He is not on cholesterol medication and denies myalgias. His cholesterol is at goal of LDL <100. The cholesterol last visit was:   Lab Results  Component Value Date   CHOL 187 08/02/2020   HDL 69 08/02/2020   LDLCALC 97 08/02/2020   TRIG 118 08/02/2020   CHOLHDL 2.7 08/02/2020    He has been working on diet and exercise for glucose management, and denies increased appetite, nausea, paresthesia of the feet, polydipsia, polyuria and visual disturbances. Last A1C in the office was:  Lab Results  Component Value Date   HGBA1C 5.0 04/05/2016   Patient is on Vitamin D supplement, taking 4000 IU daily.   Lab Results  Component Value Date   VD25OH 36 08/02/2020     He denies LUTs. Last PSA:  Lab Results  Component Value Date   PSA 0.4 04/05/2016   PSA 0.62 01/28/2014   He is on B12 tab supplement, unknown dose.  Lab Results  Component Value Date   IRSWNIOE70 350 08/02/2020      Current Medications:  Current Outpatient Medications on File Prior to Visit  Medication Sig Dispense Refill   Cyanocobalamin (VITAMIN B12 PO) Take by mouth daily.     Multiple Vitamins-Minerals (MULTIVITAMIN WITH MINERALS) tablet Take 1 tablet by mouth daily.     QUERCETIN PO Take by mouth daily.     vitamin C (ASCORBIC ACID) 500 MG tablet Take 500 mg by mouth daily.     VITAMIN D, CHOLECALCIFEROL, PO Take 4,000 Units by mouth.     Zinc 50 MG TABS Take by mouth.     No current facility-administered medications on file prior to visit.   Health Maintenance:  Immunization History  Administered Date(s)  Administered   PPD Test 01/28/2014   Tdap 03/17/2008, 06/30/2019   Tetanus: 06/2019 Pneumovax: N/A Flu vaccine: declines Shingrix: N/A Covid 19: declines  Colonoscopy: never, due age 82 - high deductible insurance, low risk, prefers to defer colonoscopy this year, receptive to cologuard EGD: N/A  Eye Exam:Glasses, Dr. Katy Fitch [facility] ([facility]), yearly, glasses Dentist: Q34month  Derm: Dr. TTeena Dunk remote, goes only PRN   Patient Care Team: MUnk Pinto MD as PCP - General (Internal Medicine) SMacarthur Critchley OArabias Referring Physician (Optometry) MBurnell Blanks MD as Consulting Physician (Cardiology) TDrusilla Kanner MD as Referring Physician (Dermatology)  Medical History:  Past Medical History:  Diagnosis Date   Anemia    Hyperlipidemia    RBBB    Retinal detachment 04/04/2009   Qualifier: Diagnosis of  By: FOrville Govern CMA, Carol     Allergies No Known Allergies  SURGICAL HISTORY He  has a past surgical history that includes Skin biopsy (Left) and Retinal detachment surgery (Right).   FAMILY HISTORY His family history includes Alcohol abuse in his father; Dementia in his father; Heart disease in his mother; Hyperlipidemia in his father and mother; Hypertension in his father; Lung cancer in his maternal grandfather; Prostate cancer in his maternal grandfather.   SOCIAL HISTORY He  reports that he quit smoking about 2 years ago. His smoking use included cigarettes. He started smoking about 7 years ago. He has a 2.00 pack-year smoking history. He has never used smokeless tobacco. He reports current alcohol use of about 6.0 - 7.0 standard drinks per week. He reports that he does not use drugs.  Review of Systems:  Review of Systems  Constitutional:  Negative for malaise/fatigue and weight loss.  HENT:  Negative for hearing loss and tinnitus.   Eyes:  Negative for blurred vision and double vision.  Respiratory:  Negative for cough, shortness of breath and wheezing.    Cardiovascular:  Negative for chest pain, palpitations, orthopnea, claudication and leg swelling.  Gastrointestinal:  Negative for abdominal pain, blood in stool, constipation, diarrhea, heartburn, melena, nausea and vomiting.  Genitourinary: Negative.   Musculoskeletal:  Negative for joint pain and myalgias.  Skin:  Negative for rash.  Neurological:  Negative for dizziness, tingling, sensory change, weakness and headaches.  Endo/Heme/Allergies:  Negative for polydipsia.  Psychiatric/Behavioral: Negative.    All other systems reviewed and are negative.  Physical Exam: Estimated body mass index is 28.07 kg/m as calculated from the following:   Height as of this encounter: 5' 10.5" (1.791 m).   Weight as of this encounter: 198 lb 6.4 oz (90 kg). BP 112/80   Pulse 82   Temp (!) 97.5 F (36.4 C)   Ht 5' 10.5" (1.791 m)   Wt 198 lb 6.4 oz (90 kg)   SpO2 97%   BMI 28.07 kg/m   General Appearance: Well nourished, in no apparent distress.  Eyes: PERRLA, EOMs, conjunctiva no swelling or erythema, normal fundi and vessels.  Sinuses: No Frontal/maxillary tenderness  ENT/Mouth: Ext aud canals clear, normal light reflex with TMs without erythema, bulging. Good dentition. No erythema, swelling, or exudate on post pharynx. Tonsils not swollen or erythematous. Hearing normal.  Neck: Supple, thyroid normal. No bruits  Respiratory: Respiratory effort normal, BS equal bilaterally without rales, rhonchi, wheezing or stridor.  Cardio: RRR without murmurs, rubs or gallops. Brisk peripheral pulses without edema.  Chest: symmetric, with normal excursions and percussion.  Abdomen: Soft, nontender, no guarding, rebound, hernias, masses, or organomegaly.  Lymphatics: Non tender without lymphadenopathy.  Genitourinary: defer, doing regular self checks, denies concerns Musculoskeletal: Full ROM all peripheral extremities,5/5 strength, and normal gait.    Skin:Warm, dry without rashes, ecchymosis.Scaly  white skin, center middle approx C7-8, irregular borders, several similar to back and x 2 to left calf Neuro: Cranial nerves intact, reflexes equal bilaterally. Normal muscle tone, no cerebellar symptoms. Sensation intact.  Psych: Awake and oriented X 3, normal affect, Insight and Judgment appropriate.   EKG: RBBB, NSR  Izora Ribas, NP 2:42 PM [facility]

## 2021-08-03 ENCOUNTER — Other Ambulatory Visit: Payer: Self-pay | Admitting: Adult Health

## 2021-08-03 DIAGNOSIS — R809 Proteinuria, unspecified: Secondary | ICD-10-CM

## 2021-08-03 DIAGNOSIS — R7989 Other specified abnormal findings of blood chemistry: Secondary | ICD-10-CM

## 2021-08-03 LAB — CBC WITH DIFFERENTIAL/PLATELET
Absolute Monocytes: 515 cells/uL (ref 200–950)
Basophils Absolute: 50 cells/uL (ref 0–200)
Basophils Relative: 0.8 %
Eosinophils Absolute: 68 cells/uL (ref 15–500)
Eosinophils Relative: 1.1 %
HCT: 50.1 % — ABNORMAL HIGH (ref 38.5–50.0)
Hemoglobin: 17.5 g/dL — ABNORMAL HIGH (ref 13.2–17.1)
Lymphs Abs: 1562 cells/uL (ref 850–3900)
MCH: 34.5 pg — ABNORMAL HIGH (ref 27.0–33.0)
MCHC: 34.9 g/dL (ref 32.0–36.0)
MCV: 98.8 fL (ref 80.0–100.0)
MPV: 10 fL (ref 7.5–12.5)
Monocytes Relative: 8.3 %
Neutro Abs: 4005 cells/uL (ref 1500–7800)
Neutrophils Relative %: 64.6 %
Platelets: 309 10*3/uL (ref 140–400)
RBC: 5.07 10*6/uL (ref 4.20–5.80)
RDW: 12.5 % (ref 11.0–15.0)
Total Lymphocyte: 25.2 %
WBC: 6.2 10*3/uL (ref 3.8–10.8)

## 2021-08-03 LAB — LIPID PANEL
Cholesterol: 187 mg/dL (ref <200)
HDL: 76 mg/dL (ref >=40)
LDL Cholesterol (Calc): 87 mg/dL (calc)
Non-HDL Cholesterol (Calc): 111 mg/dL (calc) (ref <130)
Total CHOL/HDL Ratio: 2.5 (calc) (ref <5.0)
Triglycerides: 141 mg/dL (ref <150)

## 2021-08-03 LAB — COMPLETE METABOLIC PANEL WITH GFR
AG Ratio: 2 (calc) (ref 1.0–2.5)
ALT: 41 U/L (ref 9–46)
AST: 25 U/L (ref 10–40)
Albumin: 5.1 g/dL (ref 3.6–5.1)
Alkaline phosphatase (APISO): 74 U/L (ref 36–130)
BUN: 10 mg/dL (ref 7–25)
CO2: 25 mmol/L (ref 20–32)
Calcium: 10.2 mg/dL (ref 8.6–10.3)
Chloride: 101 mmol/L (ref 98–110)
Creat: 0.97 mg/dL (ref 0.60–1.29)
Globulin: 2.5 g/dL (calc) (ref 1.9–3.7)
Glucose, Bld: 90 mg/dL (ref 65–99)
Potassium: 4.8 mmol/L (ref 3.5–5.3)
Sodium: 137 mmol/L (ref 135–146)
Total Bilirubin: 0.8 mg/dL (ref 0.2–1.2)
Total Protein: 7.6 g/dL (ref 6.1–8.1)
eGFR: 98 mL/min/{1.73_m2} (ref >=60)

## 2021-08-03 LAB — URINALYSIS, ROUTINE W REFLEX MICROSCOPIC
Bacteria, UA: NONE SEEN /HPF
Bilirubin Urine: NEGATIVE
Glucose, UA: NEGATIVE
Hgb urine dipstick: NEGATIVE
Hyaline Cast: NONE SEEN /LPF
Leukocytes,Ua: NEGATIVE
Nitrite: NEGATIVE
Specific Gravity, Urine: 1.02 (ref 1.001–1.035)
Squamous Epithelial / HPF: NONE SEEN /HPF (ref <=5)
WBC, UA: NONE SEEN /HPF (ref 0–5)
pH: 6 (ref 5.0–8.0)

## 2021-08-03 LAB — PSA: PSA: 0.35 ng/mL (ref <=4.00)

## 2021-08-03 LAB — MICROSCOPIC MESSAGE

## 2021-09-07 ENCOUNTER — Encounter: Payer: Self-pay | Admitting: Adult Health

## 2021-09-07 ENCOUNTER — Other Ambulatory Visit: Payer: Self-pay | Admitting: Adult Health

## 2021-09-07 DIAGNOSIS — R195 Other fecal abnormalities: Secondary | ICD-10-CM

## 2021-09-07 LAB — COLOGUARD: COLOGUARD: POSITIVE — AB

## 2021-11-07 ENCOUNTER — Other Ambulatory Visit: Payer: Self-pay

## 2021-11-07 DIAGNOSIS — R809 Proteinuria, unspecified: Secondary | ICD-10-CM | POA: Diagnosis not present

## 2021-11-07 DIAGNOSIS — R7989 Other specified abnormal findings of blood chemistry: Secondary | ICD-10-CM | POA: Diagnosis not present

## 2021-11-08 LAB — CBC WITH DIFFERENTIAL/PLATELET
Absolute Monocytes: 523 cells/uL (ref 200–950)
Basophils Absolute: 54 cells/uL (ref 0–200)
Basophils Relative: 0.8 %
Eosinophils Absolute: 241 cells/uL (ref 15–500)
Eosinophils Relative: 3.6 %
HCT: 47 % (ref 38.5–50.0)
Hemoglobin: 16.3 g/dL (ref 13.2–17.1)
Lymphs Abs: 1688 cells/uL (ref 850–3900)
MCH: 34.2 pg — ABNORMAL HIGH (ref 27.0–33.0)
MCHC: 34.7 g/dL (ref 32.0–36.0)
MCV: 98.5 fL (ref 80.0–100.0)
MPV: 9.8 fL (ref 7.5–12.5)
Monocytes Relative: 7.8 %
Neutro Abs: 4194 cells/uL (ref 1500–7800)
Neutrophils Relative %: 62.6 %
Platelets: 283 10*3/uL (ref 140–400)
RBC: 4.77 10*6/uL (ref 4.20–5.80)
RDW: 12.6 % (ref 11.0–15.0)
Total Lymphocyte: 25.2 %
WBC: 6.7 10*3/uL (ref 3.8–10.8)

## 2021-11-08 LAB — URINALYSIS, ROUTINE W REFLEX MICROSCOPIC
Bilirubin Urine: NEGATIVE
Glucose, UA: NEGATIVE
Hgb urine dipstick: NEGATIVE
Ketones, ur: NEGATIVE
Leukocytes,Ua: NEGATIVE
Nitrite: NEGATIVE
Protein, ur: NEGATIVE
Specific Gravity, Urine: 1.022 (ref 1.001–1.035)
pH: 6.5 (ref 5.0–8.0)

## 2021-12-08 ENCOUNTER — Encounter: Payer: Self-pay | Admitting: Gastroenterology

## 2022-01-09 ENCOUNTER — Ambulatory Visit (INDEPENDENT_AMBULATORY_CARE_PROVIDER_SITE_OTHER): Payer: PRIVATE HEALTH INSURANCE | Admitting: Gastroenterology

## 2022-01-09 ENCOUNTER — Encounter: Payer: Self-pay | Admitting: Gastroenterology

## 2022-01-09 VITALS — BP 116/68 | HR 81 | Ht 72.0 in | Wt 191.0 lb

## 2022-01-09 DIAGNOSIS — R195 Other fecal abnormalities: Secondary | ICD-10-CM

## 2022-01-09 DIAGNOSIS — K649 Unspecified hemorrhoids: Secondary | ICD-10-CM

## 2022-01-09 DIAGNOSIS — K625 Hemorrhage of anus and rectum: Secondary | ICD-10-CM | POA: Diagnosis not present

## 2022-01-09 DIAGNOSIS — Z1211 Encounter for screening for malignant neoplasm of colon: Secondary | ICD-10-CM

## 2022-01-09 NOTE — Patient Instructions (Signed)
You have been scheduled for a colonoscopy. Please follow written instructions given to you at your visit today.  Please pick up your prep supplies at the pharmacy within the next 1-3 days. If you use inhalers (even only as needed), please bring them with you on the day of your procedure.   _______________________________________________________  If you are age 46 or older, your body mass index should be between 23-30. Your Body mass index is 25.9 kg/m. If this is out of the aforementioned range listed, please consider follow up with your Primary Care Provider.  If you are age 50 or younger, your body mass index should be between 19-25. Your Body mass index is 25.9 kg/m. If this is out of the aformentioned range listed, please consider follow up with your Primary Care Provider.   ________________________________________________________  The [facility] providers would like to encourage you to use [facility] to communicate with providers for non-urgent requests or questions.  Due to long hold times on the telephone, sending your provider a message by [facility] may be a faster and more efficient way to get a response.  Please allow 48 business hours for a response.  Please remember that this is for non-urgent requests.  _______________________________________________________  Thank you for choosing me and [facility].  Dr. Rush Landmark

## 2022-01-12 ENCOUNTER — Encounter: Payer: Self-pay | Admitting: Gastroenterology

## 2022-01-12 DIAGNOSIS — K625 Hemorrhage of anus and rectum: Secondary | ICD-10-CM | POA: Insufficient documentation

## 2022-01-12 DIAGNOSIS — K649 Unspecified hemorrhoids: Secondary | ICD-10-CM | POA: Insufficient documentation

## 2022-01-12 DIAGNOSIS — Z1211 Encounter for screening for malignant neoplasm of colon: Secondary | ICD-10-CM | POA: Insufficient documentation

## 2022-01-12 NOTE — Progress Notes (Signed)
GASTROENTEROLOGY OUTPATIENT CLINIC VISIT   Primary Care Provider Unk Pinto, MD [address] [place] [address] [phone]  Referring Provider Unk Pinto, MD [address] [place],  [place] [address] [phone]  Patient Profile: Hector Bernard is a 46 y.o. male with a pmh significant for hyperlipidemia (dietary control), obesity, hemorrhoids.  The patient presents to the [facility] for an evaluation and management of problem(s) noted below:  Problem List 1. Positive colorectal cancer screening using Cologuard test   2. Colon cancer screening   3. Bright red blood per rectum   4. Hemorrhoids, unspecified hemorrhoid type     History of Present Illness This is the patient's first visit to the outpatient [facility].  The patient underwent colon cancer screening via Cologuard testing earlier this year and was found to have a positive Cologuard.  Patient states that he has experienced issues with minor bright red blood per rectum at times which she has accounted as a result of likely hemorrhoids.  He notices an irritation at times around the perineum.  His bowels are regular and move on her daily basis.  They are formed and not hard per report.  Although he will have occasional episodes of diarrhea if there is a GI bug or illness this is not a common thing for him.  There is no family history of GI malignancies.  He has never had an endoscopic evaluation.  GI Review of Systems Positive as above Negative for pyrosis, dysphagia, odynophagia, nausea, vomiting, pain, melena  Review of Systems General: Denies fevers/chills/weight loss unintentionally Cardiovascular: Denies chest pain Pulmonary: Denies shortness of breath Gastroenterological: See HPI Genitourinary: Denies darkened urine Hematological: Denies easy bruising/bleeding Dermatological: Denies jaundice Psychological: Mood is  stable   Medications Current Outpatient Medications  Medication Sig Dispense Refill   Cyanocobalamin (VITAMIN B12 PO) Take by mouth daily.     Multiple Vitamins-Minerals (MULTIVITAMIN WITH MINERALS) tablet Take 1 tablet by mouth daily.     QUERCETIN PO Take by mouth daily.     vitamin C (ASCORBIC ACID) 500 MG tablet Take 500 mg by mouth daily.     VITAMIN D, CHOLECALCIFEROL, PO Take 4,000 Units by mouth.     Zinc 50 MG TABS Take by mouth.     No current facility-administered medications for this visit.    Allergies No Known Allergies  Histories Past Medical History:  Diagnosis Date   Anemia    Hyperlipidemia    RBBB    Retinal detachment 04/04/2009   Qualifier: Diagnosis of  By: Ronne Binning     Past Surgical History:  Procedure Laterality Date   RETINAL DETACHMENT SURGERY Right    about 2003 with buckle repair   SKIN BIOPSY Left    ankle, benign   Social History   Socioeconomic History   Marital status: Married    Spouse name: Not on file   Number of children: Not on file   Years of education: Not on file   Highest education level: Not on file  Occupational History   Not on file  Tobacco Use   Smoking status: Former    Packs/day: 0.50    Years: 4.00    Total pack years: 2.00    Types: Cigarettes    Start date: 2016    Quit date: 09/19/2018    Years since quitting: 3.3   Smokeless tobacco: Never   Tobacco comments:    Current vaper  Vaping Use  Vaping Use: Some days   Start date: 03/12/2018  Substance and Sexual Activity   Alcohol use: Yes    Alcohol/week: 6.0 - 7.0 standard drinks of alcohol    Types: 6 - 7 Shots of liquor per week   Drug use: No   Sexual activity: Yes    Partners: Female    Birth control/protection: None  Other Topics Concern   Not on file  Social History Narrative   Not on file   Social Determinants of Health   Financial Resource Strain: Not on file  Food Insecurity: Not on file  Transportation Needs: Not on file   Physical Activity: Not on file  Stress: Not on file  Social Connections: Not on file  Intimate Partner Violence: Not on file   Family History  Problem Relation Age of Onset   Hyperlipidemia Mother    Heart disease Mother        [place]   Hyperlipidemia Father    Alcohol abuse Father    Hypertension Father    Dementia Father        Lewy body   Prostate cancer Maternal Grandfather        59s   Lung cancer Maternal Grandfather        smoker, 65s   Colon cancer Neg Hx    Esophageal cancer Neg Hx    Pancreatic cancer Neg Hx    Colon polyps Neg Hx    Inflammatory bowel disease Neg Hx    Liver disease Neg Hx    Rectal cancer Neg Hx    Stomach cancer Neg Hx    I have reviewed his medical, social, and family history in detail and updated the electronic medical record as necessary.    PHYSICAL EXAMINATION  BP 116/68   Pulse 81   Ht 6' (1.829 m)   Wt 191 lb (86.6 kg)   SpO2 98%   BMI 25.90 kg/m  Wt Readings from Last 3 Encounters:  01/09/22 191 lb (86.6 kg)  08/02/21 198 lb 6.4 oz (90 kg)  08/02/20 203 lb 12.8 oz (92.4 kg)  GEN: NAD, appears stated age, doesn't appear chronically ill PSYCH: Cooperative, without pressured speech EYE: Conjunctivae pink, sclerae anicteric ENT: MMM CV: RR without R/Gs  RESP: CTAB posteriorly, without wheezing GI: NABS, soft, NT/ND, without rebound or guarding, no HSM appreciated GU: DRE shows internal hemorrhoidal disease, normal perineal descent, rectal stool ball noted with brown stool noted MSK/EXT: No lower extremity edema SKIN: No jaundice NEURO:  Alert & Oriented x 3, no focal deficits   REVIEW OF DATA  I reviewed the following data at the time of this encounter:  GI Procedures and Studies  No relevant studies to review  Laboratory Studies  Reviewed those in epic  Imaging Studies  No relevant studies to review   ASSESSMENT  Mr. Casher is a 46 y.o. male with a pmh significant for hyperlipidemia (dietary control),  obesity, hemorrhoids. The patient is seen today for evaluation and management of:  1. Positive colorectal cancer screening using Cologuard test   2. Colon cancer screening   3. Bright red blood per rectum   4. Hemorrhoids, unspecified hemorrhoid type    The patient is clinically and hemodynamically stable at this time.  Patient is recommended a colonoscopy in the setting of his positive Cologuard as well as previous episodes of bright red blood per rectum.  The risks and benefits of endoscopic evaluation were discussed with the patient; these include but are not limited  to the risk of perforation, infection, bleeding, missed lesions, lack of diagnosis, severe illness requiring hospitalization, as well as anesthesia and sedation related illnesses.  The patient and/or family is agreeable to proceed.  The etiology of the patient's bright red blood most likely will end up being hemorrhoidal in nature based on examination today but will ensure nothing else is a potential source for that.  Patient will be initiated on fiber supplementation to try to keep things moving smoothly and we will decide if suppository therapy may be required in the near future or hemorrhoidal banding.  All patient questions were answered to the best of my ability, and the patient agrees to the aforementioned plan of action with follow-up as indicated.   PLAN  Proceed with scheduling colonoscopy Consider Preparation H if necessary as needed bright red blood per rectum in the interim We will consider the role of suppository therapy +/- hemorrhoidal banding therapy if nothing else is found if episodes of bright red blood per rectum persist Future Initiate fiber supplementation with FiberCon daily   Orders Placed This Encounter  Procedures   Ambulatory referral to Gastroenterology    New Prescriptions   No medications on file   Modified Medications   No medications on file    Planned Follow Up No follow-ups on  file.   Total Time in Face-to-Face and in Coordination of Care for patient including independent/personal interpretation/review of prior testing, medical history, examination, medication adjustment, communicating results with the patient directly, and documentation within the EHR is 35 minutes.   Justice Britain, MD [facility] Advanced Endoscopy Office # [phone]

## 2022-01-23 ENCOUNTER — Encounter: Payer: PRIVATE HEALTH INSURANCE | Admitting: Gastroenterology

## 2022-03-21 ENCOUNTER — Encounter: Payer: Self-pay | Admitting: Gastroenterology

## 2022-03-28 ENCOUNTER — Encounter: Payer: Self-pay | Admitting: Gastroenterology

## 2022-03-28 ENCOUNTER — Ambulatory Visit (AMBULATORY_SURGERY_CENTER): Payer: 59 | Admitting: Gastroenterology

## 2022-03-28 VITALS — BP 115/80 | HR 57 | Temp 98.6°F | Resp 15 | Ht 72.0 in | Wt 191.0 lb

## 2022-03-28 DIAGNOSIS — D127 Benign neoplasm of rectosigmoid junction: Secondary | ICD-10-CM

## 2022-03-28 DIAGNOSIS — E785 Hyperlipidemia, unspecified: Secondary | ICD-10-CM | POA: Diagnosis not present

## 2022-03-28 DIAGNOSIS — Z1211 Encounter for screening for malignant neoplasm of colon: Secondary | ICD-10-CM

## 2022-03-28 DIAGNOSIS — R195 Other fecal abnormalities: Secondary | ICD-10-CM | POA: Diagnosis not present

## 2022-03-28 DIAGNOSIS — D124 Benign neoplasm of descending colon: Secondary | ICD-10-CM

## 2022-03-28 MED ORDER — SODIUM CHLORIDE 0.9 % IV SOLN: 500.0000 mL | Freq: Once | INTRAVENOUS | Status: DC

## 2022-03-28 NOTE — Progress Notes (Signed)
Called to room to assist during endoscopic procedure.  Patient ID and intended procedure confirmed with present staff. Received instructions for my participation in the procedure from the performing physician.

## 2022-03-28 NOTE — Progress Notes (Signed)
GASTROENTEROLOGY PROCEDURE H&P NOTE   Primary Care Physician: Unk Pinto, MD  HPI: Hector Bernard is a 47 y.o. male who presents for Colonoscopy for first time screening with history of positive cologuard.  Past Medical History:  Diagnosis Date   Anemia    Hyperlipidemia    RBBB    Retinal detachment 04/04/2009   Qualifier: Diagnosis of  By: Ronne Binning     Past Surgical History:  Procedure Laterality Date   RETINAL DETACHMENT SURGERY Right    about 2003 with buckle repair   SKIN BIOPSY Left    ankle, benign   Current Outpatient Medications  Medication Sig Dispense Refill   Cyanocobalamin (VITAMIN B12 PO) Take by mouth daily.     Multiple Vitamins-Minerals (MULTIVITAMIN WITH MINERALS) tablet Take 1 tablet by mouth daily.     QUERCETIN PO Take by mouth daily.     vitamin C (ASCORBIC ACID) 500 MG tablet Take 500 mg by mouth daily.     VITAMIN D, CHOLECALCIFEROL, PO Take 4,000 Units by mouth.     Zinc 50 MG TABS Take by mouth.     Current Facility-Administered Medications  Medication Dose Route Frequency Provider Last Rate Last Admin   0.9 %  sodium chloride infusion  500 mL Intravenous Once Mansouraty, Telford Nab., MD        Current Outpatient Medications:    Cyanocobalamin (VITAMIN B12 PO), Take by mouth daily., Disp: , Rfl:    Multiple Vitamins-Minerals (MULTIVITAMIN WITH MINERALS) tablet, Take 1 tablet by mouth daily., Disp: , Rfl:    QUERCETIN PO, Take by mouth daily., Disp: , Rfl:    vitamin C (ASCORBIC ACID) 500 MG tablet, Take 500 mg by mouth daily., Disp: , Rfl:    VITAMIN D, CHOLECALCIFEROL, PO, Take 4,000 Units by mouth., Disp: , Rfl:    Zinc 50 MG TABS, Take by mouth., Disp: , Rfl:   Current Facility-Administered Medications:    0.9 %  sodium chloride infusion, 500 mL, Intravenous, Once, Mansouraty, Telford Nab., MD No Known Allergies Family History  Problem Relation Age of Onset   Hyperlipidemia Mother    Heart disease Mother         Takotsubo   Hyperlipidemia Father    Alcohol abuse Father    Hypertension Father    Dementia Father        Lewy body   Prostate cancer Maternal Grandfather        44s   Lung cancer Maternal Grandfather        smoker, 27s   Colon cancer Neg Hx    Esophageal cancer Neg Hx    Pancreatic cancer Neg Hx    Colon polyps Neg Hx    Inflammatory bowel disease Neg Hx    Liver disease Neg Hx    Rectal cancer Neg Hx    Stomach cancer Neg Hx    Social History   Socioeconomic History   Marital status: Married    Spouse name: Not on file   Number of children: Not on file   Years of education: Not on file   Highest education level: Not on file  Occupational History   Not on file  Tobacco Use   Smoking status: Former    Packs/day: 0.50    Years: 4.00    Total pack years: 2.00    Types: Cigarettes    Start date: 2016    Quit date: 09/19/2018    Years since quitting: 3.5   Smokeless tobacco:  Never   Tobacco comments:    Current vaper  Vaping Use   Vaping Use: Former   Start date: 03/12/2018  Substance and Sexual Activity   Alcohol use: Yes    Alcohol/week: 6.0 - 7.0 standard drinks of alcohol    Types: 6 - 7 Shots of liquor per week   Drug use: No   Sexual activity: Yes    Partners: Female    Birth control/protection: None  Other Topics Concern   Not on file  Social History Narrative   Not on file   Social Determinants of Health   Financial Resource Strain: Not on file  Food Insecurity: Not on file  Transportation Needs: Not on file  Physical Activity: Not on file  Stress: Not on file  Social Connections: Not on file  Intimate Partner Violence: Not on file    Physical Exam: Today's Vitals   03/28/22 0845 03/28/22 0846  BP: 121/71   Pulse: 68   Temp: 98.6 F (37 C) 98.6 F (37 C)  TempSrc: Skin   SpO2: 96%   Weight: 191 lb (86.6 kg)   Height: 6' (1.829 m)    Body mass index is 25.9 kg/m. GEN: NAD EYE: Sclerae anicteric ENT: MMM CV: Non-tachycardic GI:  Soft, NT/ND NEURO:  Alert & Oriented x 3  Lab Results: No results for input(s): "WBC", "HGB", "HCT", "PLT" in the last 72 hours. BMET No results for input(s): "NA", "K", "CL", "CO2", "GLUCOSE", "BUN", "CREATININE", "CALCIUM" in the last 72 hours. LFT No results for input(s): "PROT", "ALBUMIN", "AST", "ALT", "ALKPHOS", "BILITOT", "BILIDIR", "IBILI" in the last 72 hours. PT/INR No results for input(s): "LABPROT", "INR" in the last 72 hours.   Impression / Plan: This is a 47 y.o.male who presents for Colonoscopy for first time screening with history of positive cologuard.  The risks and benefits of endoscopic evaluation/treatment were discussed with the patient and/or family; these include but are not limited to the risk of perforation, infection, bleeding, missed lesions, lack of diagnosis, severe illness requiring hospitalization, as well as anesthesia and sedation related illnesses.  The patient's history has been reviewed, patient examined, no change in status, and deemed stable for procedure.  The patient and/or family is agreeable to proceed.    Justice Britain, MD [facility] Office # [phone]

## 2022-03-28 NOTE — Progress Notes (Signed)
VS by CW  Pt's states no medical or surgical changes since previsit or office visit.  

## 2022-03-28 NOTE — Patient Instructions (Signed)
Handouts provided on polyps, hemorrhoids and high fiber diet.   Recommend a high-fiber diet (see handout). Use FiberCon 1-2 tablets by mouth daily.  Continue present medications.  No aspirin, ibuprofen, naproxen, or other non-steriodal anti-inflammatory drugs (NSAIDs) for 1 week after polyp removal. You may use Tylenol if needed for mild pain or fever.  Await pathology results.  Repeat colonoscopy in 3 years for surveillance.  Clip card provided. Keep this card with you for the next month. If you need to have an MRI show them the clip card so that they are aware that the clips may still be in place.    YOU HAD AN ENDOSCOPIC PROCEDURE TODAY AT [facility]:   Refer to the procedure report that was given to you for any specific questions about what was found during the examination.  If the procedure report does not answer your questions, please call your gastroenterologist to clarify.  If you requested that your care partner not be given the details of your procedure findings, then the procedure report has been included in a sealed envelope for you to review at your convenience later.  YOU SHOULD EXPECT: Some feelings of bloating in the abdomen. Passage of more gas than usual.  Walking can help get rid of the air that was put into your GI tract during the procedure and reduce the bloating. If you had a lower endoscopy (such as a colonoscopy or flexible sigmoidoscopy) you may notice spotting of blood in your stool or on the toilet paper. If you underwent a bowel prep for your procedure, you may not have a normal bowel movement for a few days.  Please Note:  You might notice some irritation and congestion in your nose or some drainage.  This is from the oxygen used during your procedure.  There is no need for concern and it should clear up in a day or so.  SYMPTOMS TO REPORT IMMEDIATELY:  Following lower endoscopy (colonoscopy or flexible sigmoidoscopy):  Excessive amounts of blood in  the stool  Significant tenderness or worsening of abdominal pains  Swelling of the abdomen that is new, acute  Fever of 100F or higher  For urgent or emergent issues, a gastroenterologist can be reached at any hour by calling [phone]. Do not use MyChart messaging for urgent concerns.    DIET:  We do recommend a small meal at first, but then you may proceed to your regular diet.  Drink plenty of fluids but you should avoid alcoholic beverages for 24 hours.  ACTIVITY:  You should plan to take it easy for the rest of today and you should NOT DRIVE or use heavy machinery until tomorrow (because of the sedation medicines used during the test).    FOLLOW UP: Our staff will call the number listed on your records the next business day following your procedure.  We will call around 7:15- 8:00 am to check on you and address any questions or concerns that you may have regarding the information given to you following your procedure. If we do not reach you, we will leave a message.     If any biopsies were taken you will be contacted by phone or by letter within the next 1-3 weeks.  Please call us at [phone] if you have not heard about the biopsies in 3 weeks.    SIGNATURES/CONFIDENTIALITY: You and/or your care partner have signed paperwork which will be entered into your electronic medical record.  These signatures attest to  the fact that that the information above on your After Visit Summary has been reviewed and is understood.  Full responsibility of the confidentiality of this discharge information lies with you and/or your care-partner.

## 2022-03-28 NOTE — Op Note (Signed)
[facility] Patient Name: Hector Bernard Procedure Date: 03/28/2022 9:42 AM MRN: [id number] Endoscopist: Justice Britain , MD, [id number] Age: 47 Referring MD:  Date of Birth: 1975/09/06 Gender: Male Account #: [id number] Procedure:                Colonoscopy Indications:              Positive Cologuard test, This is the patient's                            first colonoscopy Medicines:                Monitored Anesthesia Care Procedure:                Pre-Anesthesia Assessment:                           - Prior to the procedure, a History and Physical                            was performed, and patient medications and                            allergies were reviewed. The patient's tolerance of                            previous anesthesia was also reviewed. The risks                            and benefits of the procedure and the sedation                            options and risks were discussed with the patient.                            All questions were answered, and informed consent                            was obtained. Prior Anticoagulants: The patient has                            taken no anticoagulant or antiplatelet agents. ASA                            Grade Assessment: II - A patient with mild systemic                            disease. After reviewing the risks and benefits,                            the patient was deemed in satisfactory condition to                            undergo the procedure.  After obtaining informed consent, the colonoscope                            was passed under direct vision. Throughout the                            procedure, the patient's blood pressure, pulse, and                            oxygen saturations were monitored continuously. The                            Olympus CF-HQ190L (Serial# [id number]) Colonoscope was                            introduced through the anus and advanced  to the 3                            cm into the ileum. The colonoscopy was performed                            without difficulty. The patient tolerated the                            procedure. The quality of the bowel preparation was                            adequate. The terminal ileum, ileocecal valve,                            appendiceal orifice, and rectum were photographed. Scope In: 9:46:00 AM Scope Out: 10:06:27 AM Scope Withdrawal Time: 0 hours 17 minutes 50 seconds  Total Procedure Duration: 0 hours 20 minutes 27 seconds  Findings:                 The digital rectal exam findings include                            hemorrhoids. Pertinent negatives include no                            palpable rectal lesions.                           The terminal ileum and ileocecal valve appeared                            normal.                           Two sessile polyps were found in the rectum and                            descending colon. The polyps were 3 to 4 mm in  size. These polyps were removed with a cold snare.                            Resection and retrieval were complete.                           Two polyps one pedunculated and the other                            semi-sessile were found in the rectum and                            recto-sigmoid colon. The polyps were 15 to 22 mm in                            size. Preparations were made for mucosal resection.                            Demarcation of the lesions was performed with                            high-definition white light and narrow band imaging                            to clearly identify the boundaries of each lesion.                            Saline was injected to raise each lesion. Snare                            mucosal resection was performed. Resection and                            retrieval were complete. Resected tissue margins                            were  examined and clear of polyp tissue. To prevent                            bleeding after mucosal resection, two total                            hemostatic clips were successfully placed (MR                            conditional) - one at each resection site. There                            was no bleeding during, or at the end, of the                            procedure.  Normal mucosa was found in the entire colon                            otherwise.                           Non-bleeding non-thrombosed external and internal                            hemorrhoids were found during retroflexion, during                            perianal exam and during digital exam. The                            hemorrhoids were Grade II (internal hemorrhoids                            that prolapse but reduce spontaneously). Complications:            No immediate complications. Estimated Blood Loss:     Estimated blood loss was minimal. Impression:               - Hemorrhoids found on digital rectal exam.                           - The examined portion of the ileum was normal.                           - Two 3 to 4 mm polyps in the rectum and in the                            descending colon, removed with a cold snare.                            Resected and retrieved.                           - Two 15 to 22 mm polyps in the rectum and at the                            recto-sigmoid colon,each removed with mucosal                            resection. Resected and retrieved. Clips (MR                            conditional) were placed.                           - Normal mucosa in the entire examined colon                            otherwise.                           -  Non-bleeding non-thrombosed external and internal                            hemorrhoids. Recommendation:           - The patient will be observed post-procedure,                            until all  discharge criteria are met.                           - Discharge patient to home.                           - Patient has a contact number available for                            emergencies. The signs and symptoms of potential                            delayed complications were discussed with the                            patient. Return to normal activities tomorrow.                            Written discharge instructions were provided to the                            patient.                           - High fiber diet.                           - Use FiberCon 1-2 tablets PO daily.                           - Continue present medications.                           - No aspirin, ibuprofen, naproxen, or other                            non-steroidal anti-inflammatory drugs for 1 week                            after polyp removal.                           - Await pathology results.                           - Repeat colonoscopy in 3 years for surveillance.                           - The findings and recommendations were discussed  with the patient.                           - The findings and recommendations were discussed                            with the patient's family. Justice Britain, MD 03/28/2022 10:13:13 AM

## 2022-03-28 NOTE — Progress Notes (Signed)
To pacu, VSS. Report to [email or web address] 

## 2022-03-29 ENCOUNTER — Telehealth: Payer: Self-pay

## 2022-03-29 NOTE — Telephone Encounter (Signed)
  Follow up Call-     03/28/2022    8:46 AM  Call back number  Post procedure Call Back phone  # [phone]  Permission to leave phone message Yes     Patient questions:  Do you have a fever, pain , or abdominal swelling? No. Pain Score  0 *  Have you tolerated food without any problems? Yes.    Have you been able to return to your normal activities? Yes.    Do you have any questions about your discharge instructions: Diet   No. Medications  No. Follow up visit  No.  Do you have questions or concerns about your Care? No.  Actions: * If pain score is 4 or above: No action needed, pain <4.

## 2022-04-07 ENCOUNTER — Encounter: Payer: Self-pay | Admitting: Gastroenterology

## 2022-08-03 ENCOUNTER — Encounter: Payer: 59 | Admitting: Nurse Practitioner

## 2022-08-13 NOTE — Progress Notes (Unsigned)
Complete Physical  Assessment and Plan:  Hector Bernard was seen today for annual exam.  Diagnoses and all orders for this visit:  Encounter for Annual Physical Exam with abnormal findings Due annually  Health Maintenance reviewed Healthy lifestyle reviewed and goals set  Hyperlipidemia, unspecified hyperlipidemia type Discussed dietary and exercise modifications Low fat diet -     COMPLETE METABOLIC PANEL WITH GFR -     Lipid panel  Vitamin D deficiency Taking supplement regularly, at goal 60-100 Defer check -   Anxiety state In remission off of medication Stress management techniques discussed, increase water, good sleep hygiene discussed, increase exercise, and increase veggies.  Follow up 1 month, call the office if any new AE's from medications and we will switch them  History of dysplastic nevus Hx of, no concerning areas; monitor regularly and notify any changes Protect skin with sunscreen  Overweight - BMI 28  Discussed dietary and exercise modifications  Screening for blood or protein in urine -     Urinalysis w microscopic   Screening for cardiovascular condition -     EKG 12-Lead - defer  Screening for thyroid disorder -    TSH  Abnormal Glucose Continue diet and exercise -     A1c  Right bundle branch block Was stable/unchanged last check Asymptomatic,  - EKG  Medication management Continued  B12 def Continue supplement, defer check as last at goal, high deductible insurance  Skin tag P: Skin tags are snipped off using alcohol for cleansing and sterile iris scissors. Local anesthesia was not used. These pathognomonic lesions are not sent for pathology.   Skin lesion left arm - Refer to dermatology for evaluation      Discussed med's effects and SE's. Screening labs and tests as requested with regular follow-up as recommended. Over 40 minutes of face to face interview, exam, counseling, chart review and critical decision making was  performed.  Future Appointments  Date Time Provider Department Center  08/14/2023  3:00 PM Raynelle Dick, NP [facility] None    HPI 47 y.o. male patient presents for a complete physical. He has Anxiety state; RIGHT BUNDLE BRANCH BLOCK; Hyperlipidemia; History of dysplastic nevus; Current every day nicotine vapor product user; Vitamin D deficiency; B12 deficiency; Overweight (BMI 25.0-29.9); Positive colorectal cancer screening using Cologuard test; Colon cancer screening; Bright red blood per rectum; and Hemorrhoids on their problem list.  Married, 1 daughter, 16 y/o, home schooled.  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  Reports overall he is doing well, denies concerns.   Cologuard positive 02/2022. Colonoscopy 03/28/22- 2 advanced adenomas and repeat due 3 years.   He has noticed a skin tag under his left arm x several months, does not believe it has grown. Would like it removed  Has also noticed some darkened spots on his arms, uncertain if they have gotten larger.     He stopped smoking cigarettes in 09/2018, 2 year smoking history.  Stopped vaping  Hx of anxiety well controlled on celexa 40 mg, doing well for many years, was able to taper off last year. Mood continues to be good.   BMI is Body mass index is 25.89 kg/m., he has been working on diet and exercise. He is active and walks 3-4 days a week, riding bike, and drinks a gallon or more of water a day. Denies soda, 1-2 burbon per day, has reduced.  Mostly fresh foods, 1+ serving of meat with each meal, grills, 3 veggies at least per day.  Wife uses whole grains and grinds, generally lots of healthy  Wt Readings from Last 3 Encounters:  08/14/22 183 lb (83 kg)  03/28/22 191 lb (86.6 kg)  01/09/22 191 lb (86.6 kg)   Today their BP is BP: 98/64  BP Readings from Last 3 Encounters:  08/14/22 98/64  03/28/22 115/80  01/09/22 116/68   He does workout. He denies chest pain, shortness of breath,  dizziness.   He is not on cholesterol medication and denies myalgias. His cholesterol is at goal of LDL <100. The cholesterol last visit was:   Lab Results  Component Value Date   CHOL 187 08/02/2021   HDL 76 08/02/2021   LDLCALC 87 08/02/2021   TRIG 141 08/02/2021   CHOLHDL 2.5 08/02/2021    He has been working on diet and exercise for glucose management, and denies increased appetite, nausea, paresthesia of the feet, polydipsia, polyuria and visual disturbances. Last A1C in the office was:  Lab Results  Component Value Date   HGBA1C 5.0 04/05/2016   Patient is on Vitamin D supplement, taking 4000 IU daily.   Lab Results  Component Value Date   VD25OH 53 08/02/2020     He denies LUTs. Last PSA:  Lab Results  Component Value Date   PSA 0.35 08/02/2021   PSA 0.4 04/05/2016   PSA 0.62 01/28/2014   He is on B12 tab supplement, unknown dose.  Lab Results  Component Value Date   VITAMINB12 941 08/02/2020      Current Medications:  Current Outpatient Medications on File Prior to Visit  Medication Sig Dispense Refill   Cyanocobalamin (VITAMIN B12 PO) Take by mouth daily.     MAGNESIUM PO Take by mouth.     Multiple Vitamins-Minerals (MULTIVITAMIN WITH MINERALS) tablet Take 1 tablet by mouth daily.     QUERCETIN PO Take by mouth daily.     vitamin C (ASCORBIC ACID) 500 MG tablet Take 500 mg by mouth daily.     VITAMIN D, CHOLECALCIFEROL, PO Take 4,000 Units by mouth.     Zinc 50 MG TABS Take by mouth.     No current facility-administered medications on file prior to visit.   Health Maintenance:  Immunization History  Administered Date(s) Administered   PPD Test 01/28/2014   Tdap 03/17/2008, 06/30/2019   Tetanus: 06/2019 Pneumovax: N/A Flu vaccine: declines Shingrix: N/A Covid 19: declines  Colonoscopy: 03/2022 advanced adenomas due in 3 years EGD: N/A  Eye Exam:Glasses, Dr. [facility] ([facility]), yearly, glasses Dentist: Q66months  Derm: Dr. Laney Pastor, remote,  goes only PRN   Patient Care Team: Lucky Cowboy, MD as PCP - General (Internal Medicine) Fredrich Birks, OD as Referring Physician (Optometry) Kathleene Hazel, MD as Consulting Physician (Cardiology) Anthonette Legato, MD as Referring Physician (Dermatology)  Medical History:  Past Medical History:  Diagnosis Date   Anemia    Hyperlipidemia    RBBB    Retinal detachment 04/04/2009   Qualifier: Diagnosis of  By: Denyse Amass, CMA, Carol     Allergies No Known Allergies  SURGICAL HISTORY He  has a past surgical history that includes Skin biopsy (Left) and Retinal detachment surgery (Right).   FAMILY HISTORY His family history includes Alcohol abuse in his father; Dementia in his father; Heart disease in his mother; Hyperlipidemia in his father and mother; Hypertension in his father; Lung cancer in his maternal grandfather; Prostate cancer in his maternal grandfather.   SOCIAL HISTORY He  reports that he quit smoking about 3 years  ago. His smoking use included cigarettes. He started smoking about 8 years ago. He has a 2.00 pack-year smoking history. He has never used smokeless tobacco. He reports current alcohol use of about 6.0 - 7.0 standard drinks of alcohol per week. He reports that he does not use drugs.  Review of Systems:  Review of Systems  Constitutional:  Negative for malaise/fatigue and weight loss.  HENT:  Negative for hearing loss and tinnitus.   Eyes:  Negative for blurred vision and double vision.  Respiratory:  Negative for cough, shortness of breath and wheezing.   Cardiovascular:  Negative for chest pain, palpitations, orthopnea, claudication and leg swelling.  Gastrointestinal:  Negative for abdominal pain, blood in stool, constipation, diarrhea, heartburn, melena, nausea and vomiting.  Genitourinary: Negative.   Musculoskeletal:  Negative for joint pain and myalgias.  Skin:  Negative for rash.       Skin tag left axilla, 2 brown irregular shaped areas on left  forearm  Neurological:  Negative for dizziness, tingling, sensory change, weakness and headaches.  Endo/Heme/Allergies:  Negative for polydipsia.  Psychiatric/Behavioral: Negative.    All other systems reviewed and are negative.   Physical Exam: Estimated body mass index is 25.89 kg/m as calculated from the following:   Height as of this encounter: 5' 10.5" (1.791 m).   Weight as of this encounter: 183 lb (83 kg). BP 98/64   Pulse 74   Temp 97.7 F (36.5 C)   Ht 5' 10.5" (1.791 m)   Wt 183 lb (83 kg)   SpO2 98%   BMI 25.89 kg/m   General Appearance: Well nourished, in no apparent distress.  Eyes: PERRLA, EOMs, conjunctiva no swelling or erythema, normal fundi and vessels.  Sinuses: No Frontal/maxillary tenderness  ENT/Mouth: Ext aud canals clear, normal light reflex with TMs without erythema, bulging. Good dentition. No erythema, swelling, or exudate on post pharynx. Tonsils not swollen or erythematous. Hearing normal.  Neck: Supple, thyroid normal. No bruits  Respiratory: Respiratory effort normal, BS equal bilaterally without rales, rhonchi, wheezing or stridor.  Cardio: RRR without murmurs, rubs or gallops. Brisk peripheral pulses without edema.  Chest: symmetric, with normal excursions and percussion.  Abdomen: Soft, nontender, no guarding, rebound, hernias, masses, or organomegaly.  Lymphatics: Non tender without lymphadenopathy.  Genitourinary: defer, doing regular self checks, denies concerns Musculoskeletal: Full ROM all peripheral extremities,5/5 strength, and normal gait.    Skin:Warm, dry . Skin tag left axilla, 2 brown irregular shaped areas on left forearm approx 1 cm each Neuro: Cranial nerves intact, reflexes equal bilaterally. Normal muscle tone, no cerebellar symptoms. Sensation intact.  Psych: Awake and oriented X 3, normal affect, Insight and Judgment appropriate.   EKG: RBBB, NSR   S: The patient complains of symptomatic skin tags on the left axilla .  These are irritated by clothing, jewelry and rubbing.  O: Patient appears well. One benign skin tags are noted on the left axilla.   A: Skin tags   P: Skin tags are snipped off using alcohol for cleansing and sterile iris scissors. Local anesthesia was not used. These pathognomonic lesions are not sent for pathology.   [facility]  08/14/2022

## 2022-08-14 ENCOUNTER — Encounter: Payer: Self-pay | Admitting: Nurse Practitioner

## 2022-08-14 ENCOUNTER — Ambulatory Visit (INDEPENDENT_AMBULATORY_CARE_PROVIDER_SITE_OTHER): Payer: 59 | Admitting: Nurse Practitioner

## 2022-08-14 VITALS — BP 98/64 | HR 74 | Temp 97.7°F | Ht 70.5 in | Wt 183.0 lb

## 2022-08-14 DIAGNOSIS — Z0001 Encounter for general adult medical examination with abnormal findings: Secondary | ICD-10-CM | POA: Diagnosis not present

## 2022-08-14 DIAGNOSIS — Z86018 Personal history of other benign neoplasm: Secondary | ICD-10-CM

## 2022-08-14 DIAGNOSIS — Z1329 Encounter for screening for other suspected endocrine disorder: Secondary | ICD-10-CM | POA: Diagnosis not present

## 2022-08-14 DIAGNOSIS — I10 Essential (primary) hypertension: Secondary | ICD-10-CM | POA: Diagnosis not present

## 2022-08-14 DIAGNOSIS — Z136 Encounter for screening for cardiovascular disorders: Secondary | ICD-10-CM | POA: Diagnosis not present

## 2022-08-14 DIAGNOSIS — Z1389 Encounter for screening for other disorder: Secondary | ICD-10-CM

## 2022-08-14 DIAGNOSIS — R7309 Other abnormal glucose: Secondary | ICD-10-CM | POA: Diagnosis not present

## 2022-08-14 DIAGNOSIS — Z79899 Other long term (current) drug therapy: Secondary | ICD-10-CM | POA: Diagnosis not present

## 2022-08-14 DIAGNOSIS — L989 Disorder of the skin and subcutaneous tissue, unspecified: Secondary | ICD-10-CM

## 2022-08-14 DIAGNOSIS — E663 Overweight: Secondary | ICD-10-CM

## 2022-08-14 DIAGNOSIS — Z125 Encounter for screening for malignant neoplasm of prostate: Secondary | ICD-10-CM

## 2022-08-14 DIAGNOSIS — I4510 Unspecified right bundle-branch block: Secondary | ICD-10-CM

## 2022-08-14 DIAGNOSIS — E785 Hyperlipidemia, unspecified: Secondary | ICD-10-CM | POA: Diagnosis not present

## 2022-08-14 DIAGNOSIS — E559 Vitamin D deficiency, unspecified: Secondary | ICD-10-CM | POA: Diagnosis not present

## 2022-08-14 DIAGNOSIS — E538 Deficiency of other specified B group vitamins: Secondary | ICD-10-CM

## 2022-08-14 DIAGNOSIS — F411 Generalized anxiety disorder: Secondary | ICD-10-CM

## 2022-08-14 DIAGNOSIS — R6889 Other general symptoms and signs: Secondary | ICD-10-CM | POA: Diagnosis not present

## 2022-08-14 DIAGNOSIS — L918 Other hypertrophic disorders of the skin: Secondary | ICD-10-CM | POA: Diagnosis not present

## 2022-08-14 DIAGNOSIS — Z720 Tobacco use: Secondary | ICD-10-CM

## 2022-08-14 NOTE — Patient Instructions (Signed)
GENERAL HEALTH GOALS   Know what a healthy weight is for you (roughly BMI <25) and aim to maintain this   Aim for 7+ servings of fruits and vegetables daily   70-80+ fluid ounces of water or unsweet tea for healthy kidneys   Limit to max 1 drink of alcohol per day; avoid smoking/tobacco   Limit animal fats in diet for cholesterol and heart health - choose grass fed whenever available   Avoid highly processed foods, and foods high in saturated/trans fats   Aim for low stress - take time to unwind and care for your mental health   Aim for 150 min of moderate intensity exercise weekly for heart health, and weights twice weekly for bone health   Aim for 7-9 hours of sleep daily Skin Tag, Adult A skin tag (acrochordon) is a soft, extra growth of skin. Most skin tags are skin-colored and rarely bigger than a pencil eraser. They often form in areas where there is frequent rubbing, or friction, on the skin. This may be where there are folds in the skin, such as: The eyelids. The neck. The armpits. The groin. Skin tags are not dangerous, and they do not spread from person to person (are not contagious). You may have one skin tag or many. Skin tags do not need treatment. However, your health care provider may recommend removing a skin tag if it: Gets irritated from clothing or jewelry. Bleeds. Is visible and unsightly. What are the causes? This condition is linked to: Increasing age. Pregnancy. Diabetes. Obesity. What are the signs or symptoms? Skin tags usually do not cause symptoms unless they get irritated by items touching your skin, such as clothing or jewelry. When this happens, you may have pain, itching, or bleeding. How is this diagnosed? This condition is diagnosed with an evaluation from your health care provider. No testing is needed for diagnosis. How is this treated? Treatment for this condition depends on whether you have symptoms. Your health care provider may also  remove your skin tag if it is visible or if you do not like the way it looks. A skin tag can be removed by a health care provider with: A simple surgical procedure using scissors. A procedure that involves freezing your skin tag with a gas in liquid form (liquid nitrogen). A procedure that uses heat to destroy your skin tag (electrodessication). Follow these instructions at home: Watch for any changes in your skin tag. A normal skin tag does not require any other special care at home. Take over-the-counter and prescription medicines only as told by your health care provider. Keep all follow-up visits. Contact a health care provider if: You have a skin tag that: Becomes painful. Changes color. Bleeds. Swells. Summary Skin tags are soft, extra growths of skin found in areas of frequent rubbing or friction. Skin tags usually do not cause symptoms. If symptoms occur, you may have pain, itching, or bleeding. Your health care provider may remove your skin tag if it causes symptoms or if you do not like the way it looks. This information is not intended to replace advice given to you by your health care provider. Make sure you discuss any questions you have with your health care provider. Document Revised: 04/12/2021 Document Reviewed: 04/12/2021 Elsevier Patient Education  2024 [employer].

## 2022-08-15 LAB — COMPLETE METABOLIC PANEL WITH GFR
AG Ratio: 2.3 (calc) (ref 1.0–2.5)
ALT: 13 U/L (ref 9–46)
AST: 11 U/L (ref 10–40)
Albumin: 4.8 g/dL (ref 3.6–5.1)
Alkaline phosphatase (APISO): 61 U/L (ref 36–130)
BUN: 12 mg/dL (ref 7–25)
CO2: 27 mmol/L (ref 20–32)
Calcium: 9.8 mg/dL (ref 8.6–10.3)
Chloride: 106 mmol/L (ref 98–110)
Creat: 0.99 mg/dL (ref 0.60–1.29)
Globulin: 2.1 g/dL (calc) (ref 1.9–3.7)
Glucose, Bld: 89 mg/dL (ref 65–99)
Potassium: 4.2 mmol/L (ref 3.5–5.3)
Sodium: 142 mmol/L (ref 135–146)
Total Bilirubin: 0.5 mg/dL (ref 0.2–1.2)
Total Protein: 6.9 g/dL (ref 6.1–8.1)
eGFR: 95 mL/min/{1.73_m2} (ref >=60)

## 2022-08-15 LAB — URINALYSIS, ROUTINE W REFLEX MICROSCOPIC
Bilirubin Urine: NEGATIVE
Glucose, UA: NEGATIVE
Hgb urine dipstick: NEGATIVE
Ketones, ur: NEGATIVE
Leukocytes,Ua: NEGATIVE
Nitrite: NEGATIVE
Protein, ur: NEGATIVE
Specific Gravity, Urine: 1.021 (ref 1.001–1.035)
pH: 6.5 (ref 5.0–8.0)

## 2022-08-15 LAB — CBC WITH DIFFERENTIAL/PLATELET
Absolute Monocytes: 476 cells/uL (ref 200–950)
Basophils Absolute: 49 cells/uL (ref 0–200)
Basophils Relative: 0.8 %
Eosinophils Absolute: 220 cells/uL (ref 15–500)
Eosinophils Relative: 3.6 %
HCT: 48.9 % (ref 38.5–50.0)
Hemoglobin: 16.7 g/dL (ref 13.2–17.1)
Lymphs Abs: 1653 cells/uL (ref 850–3900)
MCH: 33.5 pg — ABNORMAL HIGH (ref 27.0–33.0)
MCHC: 34.2 g/dL (ref 32.0–36.0)
MCV: 98 fL (ref 80.0–100.0)
MPV: 10.2 fL (ref 7.5–12.5)
Monocytes Relative: 7.8 %
Neutro Abs: 3703 cells/uL (ref 1500–7800)
Neutrophils Relative %: 60.7 %
Platelets: 305 10*3/uL (ref 140–400)
RBC: 4.99 10*6/uL (ref 4.20–5.80)
RDW: 12.9 % (ref 11.0–15.0)
Total Lymphocyte: 27.1 %
WBC: 6.1 10*3/uL (ref 3.8–10.8)

## 2022-08-15 LAB — VITAMIN D 25 HYDROXY (VIT D DEFICIENCY, FRACTURES): Vit D, 25-Hydroxy: 90 ng/mL (ref 30–100)

## 2022-08-15 LAB — LIPID PANEL
Cholesterol: 164 mg/dL (ref <200)
HDL: 67 mg/dL (ref >=40)
LDL Cholesterol (Calc): 78 mg/dL (calc)
Non-HDL Cholesterol (Calc): 97 mg/dL (calc) (ref <130)
Total CHOL/HDL Ratio: 2.4 (calc) (ref <5.0)
Triglycerides: 109 mg/dL (ref <150)

## 2022-08-15 LAB — HEMOGLOBIN A1C
Hgb A1c MFr Bld: 5.5 % of total Hgb (ref <5.7)
Mean Plasma Glucose: 111 mg/dL
eAG (mmol/L): 6.2 mmol/L

## 2022-08-15 LAB — MAGNESIUM: Magnesium: 2.2 mg/dL (ref 1.5–2.5)

## 2022-08-15 LAB — TSH: TSH: 1.27 mIU/L (ref 0.40–4.50)

## 2022-10-23 DIAGNOSIS — L720 Epidermal cyst: Secondary | ICD-10-CM | POA: Diagnosis not present

## 2022-10-23 DIAGNOSIS — L570 Actinic keratosis: Secondary | ICD-10-CM | POA: Diagnosis not present

## 2022-10-23 DIAGNOSIS — L814 Other melanin hyperpigmentation: Secondary | ICD-10-CM | POA: Diagnosis not present

## 2022-10-23 DIAGNOSIS — L821 Other seborrheic keratosis: Secondary | ICD-10-CM | POA: Diagnosis not present

## 2023-05-08 ENCOUNTER — Encounter: Payer: Self-pay | Admitting: Family Medicine

## 2023-05-08 ENCOUNTER — Ambulatory Visit (INDEPENDENT_AMBULATORY_CARE_PROVIDER_SITE_OTHER): Payer: 59 | Admitting: Family Medicine

## 2023-05-08 VITALS — BP 106/75 | HR 64 | Ht 72.0 in | Wt 182.0 lb

## 2023-05-08 DIAGNOSIS — E785 Hyperlipidemia, unspecified: Secondary | ICD-10-CM

## 2023-05-08 DIAGNOSIS — E559 Vitamin D deficiency, unspecified: Secondary | ICD-10-CM

## 2023-05-08 DIAGNOSIS — E538 Deficiency of other specified B group vitamins: Secondary | ICD-10-CM

## 2023-05-08 DIAGNOSIS — Z0000 Encounter for general adult medical examination without abnormal findings: Secondary | ICD-10-CM

## 2023-05-08 NOTE — Assessment & Plan Note (Signed)
 Continue OTC supplement. Stable last year. Repeat at next physical labs.

## 2023-05-08 NOTE — Progress Notes (Signed)
 New Patient Office Visit  Subjective    Patient ID: Hector Bernard, male    DOB: [birth year]  Age: 48 y.o. MRN: [id number]  CC:  Chief Complaint  Patient presents with   Establish Care    HPI Hector Bernard presents to establish care. He lives with his wife and 56 year old daughter. He owns [employer].      Discussed the use of AI scribe software for clinical note transcription with the patient, who gave verbal consent to proceed.  History of Present Illness The patient presents for an initial consultation to establish care.  They had a physical exam last June and are due for another in June of this year. They are here to complete the necessary steps to establish care and schedule their next physical exam.  They have a history of anemia, high cholesterol, retinal detachment, low vitamin D, and B12 levels.  They are currently taking vitamin D and B12 supplements, and other vitamins/minerals over-the-counter.  They have a family history of high cholesterol on both sides, high blood pressure and alcohol use in their father, and heart disease in their mother, who had a mild heart attack in her 71s. Their maternal grandfather had lung cancer and prostate cancer, and their maternal grandmother had lung cancer and emphysema.  They live with their wife and 32 year old daughter. They work as one of the Psychologist, sport and exercise. They do not smoke and have no known medication allergies. They feel good and sleep well with no major problems.        Outpatient Encounter Medications as of 05/08/2023  Medication Sig   Cyanocobalamin (VITAMIN B12 PO) Take by mouth daily.   MAGNESIUM PO Take by mouth.   Multiple Vitamins-Minerals (MULTIVITAMIN WITH MINERALS) tablet Take 1 tablet by mouth daily.   QUERCETIN PO Take by mouth daily.   vitamin C (ASCORBIC ACID) 500 MG tablet Take 500 mg by mouth daily.   VITAMIN D, CHOLECALCIFEROL, PO Take 4,000 Units by mouth.   Zinc 50 MG  TABS Take by mouth.   No facility-administered encounter medications on file as of 05/08/2023.    Past Medical History:  Diagnosis Date   Anemia    Hyperlipidemia    RBBB    Retinal detachment 04/04/2009   Qualifier: Diagnosis of  By: Wendee Copp      Past Surgical History:  Procedure Laterality Date   EYE SURGERY  2005   Detached Retina   RETINAL DETACHMENT SURGERY Right    about 2003 with buckle repair   SKIN BIOPSY Left    ankle, benign    Family History  Problem Relation Age of Onset   Hyperlipidemia Mother    Heart disease Mother        [place]otsubo   Hyperlipidemia Father    Alcohol abuse Father    Hypertension Father    Dementia Father        Lewy body   Cancer Maternal Grandmother    Prostate cancer Maternal Grandfather        76s   Lung cancer Maternal Grandfather        smoker, 53s   Cancer Maternal Grandfather    Colon cancer Neg Hx    Esophageal cancer Neg Hx    Pancreatic cancer Neg Hx    Colon polyps Neg Hx    Inflammatory bowel disease Neg Hx    Liver disease Neg Hx    Rectal cancer Neg Hx    Stomach cancer Neg  Hx     Social History   Socioeconomic History   Marital status: Married    Spouse name: Not on file   Number of children: Not on file   Years of education: Not on file   Highest education level: Bachelor's degree (e.g., BA, AB, BS)  Occupational History   Not on file  Tobacco Use   Smoking status: Former    Current packs/day: 0.00    Average packs/day: 0.5 packs/day for 4.5 years (2.3 ttl pk-yrs)    Types: Cigarettes    Start date: 2016    Quit date: 09/19/2018    Years since quitting: 4.6   Smokeless tobacco: Never   Tobacco comments:    Current vaper  Vaping Use   Vaping status: Former   Start date: 03/12/2018  Substance and Sexual Activity   Alcohol use: Yes    Alcohol/week: 6.0 - 7.0 standard drinks of alcohol    Types: 6 - 7 Shots of liquor per week   Drug use: No   Sexual activity: Yes    Partners: Female     Birth control/protection: None  Other Topics Concern   Not on file  Social History Narrative   Not on file   Social Drivers of Health   Financial Resource Strain: Low Risk  (05/07/2023)   Overall Financial Resource Strain (CARDIA)    Difficulty of Paying Living Expenses: Not very hard  Food Insecurity: No Food Insecurity (05/07/2023)   Hunger Vital Sign    Worried About Running Out of Food in the Last Year: Never true    Ran Out of Food in the Last Year: Never true  Transportation Needs: No Transportation Needs (05/07/2023)   PRAPARE - Administrator, Civil Service (Medical): No    Lack of Transportation (Non-Medical): No  Physical Activity: Sufficiently Active (05/07/2023)   Exercise Vital Sign    Days of Exercise per Week: 4 days    Minutes of Exercise per Session: 60 min  Stress: Stress Concern Present (05/07/2023)   [employer] of Occupational Health - Occupational Stress Questionnaire    Feeling of Stress : To some extent  Social Connections: Unknown (05/07/2023)   Social Connection and Isolation Panel [NHANES]    Frequency of Communication with Friends and Family: More than three times a week    Frequency of Social Gatherings with Friends and Family: Once a week    Attends Religious Services: Patient declined    Database administrator or Organizations: No    Attends Engineer, structural: Not on file    Marital Status: Married  Catering manager Violence: Not on file    ROS All review of systems negative except what is listed in the HPI      Objective    BP 106/75   Pulse 64   Ht 6' (1.829 m)   Wt 182 lb (82.6 kg)   SpO2 97%   BMI 24.68 kg/m   Physical Exam Vitals reviewed.  Constitutional:      Appearance: Normal appearance.  Cardiovascular:     Rate and Rhythm: Normal rate and regular rhythm.  Pulmonary:     Effort: Pulmonary effort is normal.     Breath sounds: Normal breath sounds.  Skin:    General: Skin is warm and dry.   Neurological:     Mental Status: He is alert and oriented to person, place, and time.  Psychiatric:        Mood and Affect: Mood  normal.        Behavior: Behavior normal.        Thought Content: Thought content normal.        Judgment: Judgment normal.               Assessment & Plan:   Problem List Items Addressed This Visit       Active Problems   Hyperlipidemia - Primary   Medication management: lifestyle measures Lifestyle factors for lowering cholesterol include: Diet therapy - heart-healthy diet rich in fruits, veggies, fiber-rich whole grains, lean meats, chicken, fish (at least twice a week), fat-free or 1% dairy products; foods low in saturated/trans fats, cholesterol, sodium, and sugar. Mediterranean diet has shown to be very heart healthy. Regular exercise - recommend at least 30 minutes a day, 5 times per week Weight management  Repeat CMP and lipid panel at physical this summer. Stable last year.        Vitamin D deficiency   Continue OTC supplement. Stable last year. Repeat at next physical labs.       B12 deficiency   OTC supplement. Previously stable. Will repeat labs when we do his physical this summer.       Other Visit Diagnoses       Encounter for medical examination to establish care           Return for physical anytime after June 4th.   Clayborne Dana, NP

## 2023-05-08 NOTE — Assessment & Plan Note (Signed)
 OTC supplement. Previously stable. Will repeat labs when we do his physical this summer.

## 2023-05-08 NOTE — Assessment & Plan Note (Signed)
 Medication management: lifestyle measures Lifestyle factors for lowering cholesterol include: Diet therapy - heart-healthy diet rich in fruits, veggies, fiber-rich whole grains, lean meats, chicken, fish (at least twice a week), fat-free or 1% dairy products; foods low in saturated/trans fats, cholesterol, sodium, and sugar. Mediterranean diet has shown to be very heart healthy. Regular exercise - recommend at least 30 minutes a day, 5 times per week Weight management  Repeat CMP and lipid panel at physical this summer. Stable last year.

## 2023-08-14 ENCOUNTER — Encounter: Payer: 59 | Admitting: Nurse Practitioner

## 2023-08-15 ENCOUNTER — Ambulatory Visit (INDEPENDENT_AMBULATORY_CARE_PROVIDER_SITE_OTHER): Payer: 59 | Admitting: Family Medicine

## 2023-08-15 ENCOUNTER — Encounter: Payer: Self-pay | Admitting: Family Medicine

## 2023-08-15 VITALS — BP 108/74 | HR 60 | Ht 72.0 in | Wt 184.0 lb

## 2023-08-15 DIAGNOSIS — E785 Hyperlipidemia, unspecified: Secondary | ICD-10-CM | POA: Diagnosis not present

## 2023-08-15 DIAGNOSIS — Z0000 Encounter for general adult medical examination without abnormal findings: Secondary | ICD-10-CM

## 2023-08-15 DIAGNOSIS — E559 Vitamin D deficiency, unspecified: Secondary | ICD-10-CM | POA: Diagnosis not present

## 2023-08-15 DIAGNOSIS — E538 Deficiency of other specified B group vitamins: Secondary | ICD-10-CM | POA: Diagnosis not present

## 2023-08-15 LAB — CBC WITH DIFFERENTIAL/PLATELET
Basophils Absolute: 0 10*3/uL (ref 0.0–0.1)
Basophils Relative: 1.1 % (ref 0.0–3.0)
Eosinophils Absolute: 0.2 10*3/uL (ref 0.0–0.7)
Eosinophils Relative: 4.6 % (ref 0.0–5.0)
HCT: 45.5 % (ref 39.0–52.0)
Hemoglobin: 15.6 g/dL (ref 13.0–17.0)
Lymphocytes Relative: 38.2 % (ref 12.0–46.0)
Lymphs Abs: 1.7 10*3/uL (ref 0.7–4.0)
MCHC: 34.2 g/dL (ref 30.0–36.0)
MCV: 97.3 fl (ref 78.0–100.0)
Monocytes Absolute: 0.3 10*3/uL (ref 0.1–1.0)
Monocytes Relative: 6.3 % (ref 3.0–12.0)
Neutro Abs: 2.3 10*3/uL (ref 1.4–7.7)
Neutrophils Relative %: 49.8 % (ref 43.0–77.0)
Platelets: 294 10*3/uL (ref 150.0–400.0)
RBC: 4.68 Mil/uL (ref 4.22–5.81)
RDW: 13.8 % (ref 11.5–15.5)
WBC: 4.5 10*3/uL (ref 4.0–10.5)

## 2023-08-15 LAB — COMPREHENSIVE METABOLIC PANEL WITH GFR
ALT: 15 U/L (ref 0–53)
AST: 12 U/L (ref 0–37)
Albumin: 4.8 g/dL (ref 3.5–5.2)
Alkaline Phosphatase: 61 U/L (ref 39–117)
BUN: 12 mg/dL (ref 6–23)
CO2: 23 meq/L (ref 19–32)
Calcium: 9.8 mg/dL (ref 8.4–10.5)
Chloride: 103 meq/L (ref 96–112)
Creatinine, Ser: 0.92 mg/dL (ref 0.40–1.50)
GFR: 98.77 mL/min (ref >60.00)
Glucose, Bld: 95 mg/dL (ref 70–99)
Potassium: 4.4 meq/L (ref 3.5–5.1)
Sodium: 139 meq/L (ref 135–145)
Total Bilirubin: 0.7 mg/dL (ref 0.2–1.2)
Total Protein: 6.9 g/dL (ref 6.0–8.3)

## 2023-08-15 LAB — LIPID PANEL
Cholesterol: 181 mg/dL (ref 0–200)
HDL: 66.4 mg/dL (ref >39.00)
LDL Cholesterol: 87 mg/dL (ref 0–99)
NonHDL: 114.24
Total CHOL/HDL Ratio: 3
Triglycerides: 134 mg/dL (ref 0.0–149.0)
VLDL: 26.8 mg/dL (ref 0.0–40.0)

## 2023-08-15 LAB — TSH: TSH: 1.83 u[IU]/mL (ref 0.35–5.50)

## 2023-08-15 LAB — VITAMIN B12: Vitamin B-12: 1305 pg/mL — ABNORMAL HIGH (ref 211–911)

## 2023-08-15 NOTE — Progress Notes (Signed)
 Complete physical exam  Patient: Hector Bernard   DOB: 1975/10/28   48 y.o. Male  MRN: [id number]  Subjective:     Chief Complaint  Patient presents with   Annual Exam    Hector Bernard is a 48 y.o. male who presents today for a complete physical exam. He reports consuming a general diet. Home exercise routine includes walking. He generally feels well. He reports sleeping well. He does not have additional problems to discuss today.   Currently lives with: family Acute concerns or interim problems since last visit: no  Vision concerns: no Dental concerns: no STD concerns: no  ETOH use: occasional  Nicotine use: no Recreational drugs/illegal substances: no       Most recent fall risk assessment:    08/15/2023   10:13 AM  Fall Risk   Falls in the past year? 0  Number falls in past yr: 0  Injury with Fall? 0  Risk for fall due to : No Fall Risks  Follow up Falls evaluation completed     Most recent depression screenings:    08/15/2023   10:13 AM 05/08/2023   10:02 AM  PHQ 2/9 Scores  PHQ - 2 Score 0 0  PHQ- 9 Score 0             Patient Care Team: Everlina Hock, NP as PCP - General (Family Medicine) Nelva Bang, OD as Referring Physician (Optometry) Odie Benne, MD as Consulting Physician (Cardiology) Antoine Bathe, MD as Referring Physician (Dermatology)   Outpatient Medications Prior to Visit  Medication Sig   Cyanocobalamin (VITAMIN B12 PO) Take by mouth daily.   MAGNESIUM PO Take by mouth.   Multiple Vitamins-Minerals (MULTIVITAMIN WITH MINERALS) tablet Take 1 tablet by mouth daily.   QUERCETIN PO Take by mouth daily.   vitamin C (ASCORBIC ACID) 500 MG tablet Take 500 mg by mouth daily.   VITAMIN D , CHOLECALCIFEROL, PO Take 4,000 Units by mouth.   Zinc 50 MG TABS Take by mouth.   No facility-administered medications prior to visit.    ROS All review of systems negative except what is listed in the HPI        Objective:      BP 108/74   Pulse 60   Ht 6' (1.829 m)   Wt 184 lb (83.5 kg)   SpO2 97%   BMI 24.95 kg/m    Physical Exam Vitals reviewed.  Constitutional:      General: He is not in acute distress.    Appearance: Normal appearance. He is not ill-appearing.  HENT:     Head: Normocephalic and atraumatic.     Right Ear: Tympanic membrane normal.     Left Ear: Tympanic membrane normal.     Nose: Nose normal.     Mouth/Throat:     Mouth: Mucous membranes are moist.     Pharynx: Oropharynx is clear.  Eyes:     Extraocular Movements: Extraocular movements intact.     Conjunctiva/sclera: Conjunctivae normal.     Pupils: Pupils are equal, round, and reactive to light.  Cardiovascular:     Rate and Rhythm: Normal rate and regular rhythm.     Pulses: Normal pulses.     Heart sounds: Normal heart sounds.  Pulmonary:     Effort: Pulmonary effort is normal.     Breath sounds: Normal breath sounds.  Abdominal:     General: Abdomen is flat. Bowel sounds are normal. There is no distension.  Palpations: Abdomen is soft. There is no mass.     Tenderness: There is no abdominal tenderness. There is no right CVA tenderness, left CVA tenderness, guarding or rebound.  Genitourinary:    Comments: Deferred exam Musculoskeletal:        General: Normal range of motion.     Cervical back: Normal range of motion and neck supple. No tenderness.     Right lower leg: No edema.     Left lower leg: No edema.  Lymphadenopathy:     Cervical: No cervical adenopathy.  Skin:    General: Skin is warm and dry.     Capillary Refill: Capillary refill takes less than 2 seconds.  Neurological:     General: No focal deficit present.     Mental Status: He is alert and oriented to person, place, and time. Mental status is at baseline.  Psychiatric:        Mood and Affect: Mood normal.        Behavior: Behavior normal.        Thought Content: Thought content normal.        Judgment: Judgment normal.          No results found for any visits on 08/15/23.     Assessment & Plan:    Routine Health Maintenance and Physical Exam Discussed health promotion and safety including diet and exercise recommendations, dental health, and injury prevention. Tobacco cessation if applicable. Seat belts, sunscreen, smoke detectors, etc.    Immunization History  Administered Date(s) Administered   DTaP 11/25/1975, 04/28/1976, 07/11/1976, 08/31/1977, 07/16/1979   IPV 11/25/1975, 04/28/1976, 08/31/1977, 01/24/1982   MMR 12/11/1976, 10/04/1993   PPD Test 01/28/2014   Td 10/04/1993   Tdap 03/17/2008, 06/30/2019    Health Maintenance  Topic Date Due   COVID-19 Vaccine (1) 05/07/2024 (Originally 09/11/1980)   INFLUENZA VACCINE  10/11/2023   Colonoscopy  03/28/2025   DTaP/Tdap/Td (8 - Td or Tdap) 06/29/2029   HPV VACCINES  Aged Out   Meningococcal B Vaccine  Aged Out   Hepatitis C Screening  Discontinued   Fecal DNA (Cologuard)  Discontinued   HIV Screening  Discontinued        Problem List Items Addressed This Visit       Active Problems   Hyperlipidemia   Relevant Orders   Lipid panel   Vitamin D  deficiency   B12 deficiency   Relevant Orders   Vitamin B12   Other Visit Diagnoses       Annual physical exam    -  Primary   Relevant Orders   CBC with Differential/Platelet   Comprehensive metabolic panel with GFR   TSH          PATIENT COUNSELING:    Recommend that most people either abstain from alcohol or drink within safe limits (<=14/week and <=4 drinks/occasion for males, <=7/weeks and <= 3 drinks/occasion for females) and that the risk for alcohol disorders and other health effects rises proportionally with the number of drinks per week and how often a drinker exceeds daily limits.   Diet: Recommend to adjust caloric intake to maintain or achieve ideal body weight, to reduce intake of dietary saturated fat and total fat, to limit sodium intake by avoiding high sodium  foods and not adding table salt, and to maintain adequate dietary potassium and calcium preferably from fresh fruits, vegetables, and low-fat dairy products.   Emphasized the importance of regular exercise.  Injury prevention: Recommend seatbelts, safety helmets, smoke detector, etc..  Dental health: Recommend regular tooth brushing, flossing, and dental visits.       Return in about 1 year (around 08/14/2024) for physical.     Everlina Hock, NP

## 2023-08-16 ENCOUNTER — Ambulatory Visit: Payer: Self-pay | Admitting: Family Medicine

## 2024-08-18 ENCOUNTER — Encounter: Admitting: Family Medicine
